# Patient Record
Sex: Female | Born: 1940 | Race: White | Hispanic: No | Marital: Married | Smoking: Former smoker
Health system: Southern US, Community
[De-identification: ages and names within clinical notes are randomized; demographics above are authoritative.]

## PROBLEM LIST (undated history)

## (undated) DIAGNOSIS — C801 Malignant (primary) neoplasm, unspecified: Secondary | ICD-10-CM

## (undated) DIAGNOSIS — E079 Disorder of thyroid, unspecified: Secondary | ICD-10-CM

## (undated) DIAGNOSIS — I10 Essential (primary) hypertension: Secondary | ICD-10-CM

## (undated) HISTORY — PX: ABDOMINAL HYSTERECTOMY: SHX81

## (undated) HISTORY — PX: LOBECTOMY: SHX5089

---

## 2015-05-12 ENCOUNTER — Emergency Department (HOSPITAL_COMMUNITY): Payer: Medicare Other

## 2015-05-12 ENCOUNTER — Encounter (HOSPITAL_COMMUNITY): Payer: Self-pay

## 2015-05-12 ENCOUNTER — Inpatient Hospital Stay (HOSPITAL_COMMUNITY)
Admission: EM | Admit: 2015-05-12 | Discharge: 2015-05-17 | DRG: 470 | Disposition: A | Discharge status: Skilled Nursing Facility | Payer: Medicare Other | Attending: Internal Medicine | Admitting: Internal Medicine

## 2015-05-12 ENCOUNTER — Inpatient Hospital Stay (HOSPITAL_COMMUNITY): Payer: Medicare Other

## 2015-05-12 DIAGNOSIS — D72829 Elevated white blood cell count, unspecified: Secondary | ICD-10-CM | POA: Insufficient documentation

## 2015-05-12 DIAGNOSIS — E870 Hyperosmolality and hypernatremia: Secondary | ICD-10-CM | POA: Diagnosis present

## 2015-05-12 DIAGNOSIS — Y92008 Other place in unspecified non-institutional (private) residence as the place of occurrence of the external cause: Secondary | ICD-10-CM

## 2015-05-12 DIAGNOSIS — Z85118 Personal history of other malignant neoplasm of bronchus and lung: Secondary | ICD-10-CM

## 2015-05-12 DIAGNOSIS — S72001S Fracture of unspecified part of neck of right femur, sequela: Secondary | ICD-10-CM | POA: Diagnosis not present

## 2015-05-12 DIAGNOSIS — I10 Essential (primary) hypertension: Secondary | ICD-10-CM | POA: Diagnosis present

## 2015-05-12 DIAGNOSIS — Z79899 Other long term (current) drug therapy: Secondary | ICD-10-CM

## 2015-05-12 DIAGNOSIS — S72009A Fracture of unspecified part of neck of unspecified femur, initial encounter for closed fracture: Secondary | ICD-10-CM | POA: Diagnosis present

## 2015-05-12 DIAGNOSIS — K5900 Constipation, unspecified: Secondary | ICD-10-CM | POA: Diagnosis present

## 2015-05-12 DIAGNOSIS — Z902 Acquired absence of lung [part of]: Secondary | ICD-10-CM | POA: Diagnosis present

## 2015-05-12 DIAGNOSIS — W010XXA Fall on same level from slipping, tripping and stumbling without subsequent striking against object, initial encounter: Secondary | ICD-10-CM | POA: Diagnosis present

## 2015-05-12 DIAGNOSIS — S72001A Fracture of unspecified part of neck of right femur, initial encounter for closed fracture: Secondary | ICD-10-CM | POA: Insufficient documentation

## 2015-05-12 DIAGNOSIS — Z96649 Presence of unspecified artificial hip joint: Secondary | ICD-10-CM

## 2015-05-12 DIAGNOSIS — Z87891 Personal history of nicotine dependence: Secondary | ICD-10-CM

## 2015-05-12 DIAGNOSIS — E039 Hypothyroidism, unspecified: Secondary | ICD-10-CM | POA: Diagnosis present

## 2015-05-12 DIAGNOSIS — S72011A Unspecified intracapsular fracture of right femur, initial encounter for closed fracture: Principal | ICD-10-CM | POA: Diagnosis present

## 2015-05-12 DIAGNOSIS — D509 Iron deficiency anemia, unspecified: Secondary | ICD-10-CM | POA: Diagnosis present

## 2015-05-12 DIAGNOSIS — M25551 Pain in right hip: Secondary | ICD-10-CM | POA: Diagnosis present

## 2015-05-12 HISTORY — DX: Disorder of thyroid, unspecified: E07.9

## 2015-05-12 HISTORY — DX: Essential (primary) hypertension: I10

## 2015-05-12 HISTORY — DX: Malignant (primary) neoplasm, unspecified: C80.1

## 2015-05-12 LAB — CBC WITH DIFFERENTIAL/PLATELET
Basophils Absolute: 0 10*3/uL (ref 0.0–0.1)
Basophils Relative: 0 % (ref 0–1)
Eosinophils Absolute: 0 10*3/uL (ref 0.0–0.7)
Eosinophils Relative: 0 % (ref 0–5)
HEMATOCRIT: 46.6 % — AB (ref 36.0–46.0)
HEMOGLOBIN: 15.9 g/dL — AB (ref 12.0–15.0)
Lymphocytes Relative: 5 % — ABNORMAL LOW (ref 12–46)
Lymphs Abs: 0.8 10*3/uL (ref 0.7–4.0)
MCH: 30.9 pg (ref 26.0–34.0)
MCHC: 34.1 g/dL (ref 30.0–36.0)
MCV: 90.7 fL (ref 78.0–100.0)
MONO ABS: 0.7 10*3/uL (ref 0.1–1.0)
Monocytes Relative: 4 % (ref 3–12)
NEUTROS PCT: 91 % — AB (ref 43–77)
Neutro Abs: 15.5 10*3/uL — ABNORMAL HIGH (ref 1.7–7.7)
Platelets: 218 10*3/uL (ref 150–400)
RBC: 5.14 MIL/uL — ABNORMAL HIGH (ref 3.87–5.11)
RDW: 13.2 % (ref 11.5–15.5)
WBC: 16.9 10*3/uL — ABNORMAL HIGH (ref 4.0–10.5)

## 2015-05-12 LAB — COMPREHENSIVE METABOLIC PANEL
ALBUMIN: 5 g/dL (ref 3.5–5.0)
ALT: 25 U/L (ref 14–54)
AST: 31 U/L (ref 15–41)
Alkaline Phosphatase: 98 U/L (ref 38–126)
Anion gap: 9 (ref 5–15)
BUN: 14 mg/dL (ref 6–20)
CALCIUM: 8.5 mg/dL — AB (ref 8.9–10.3)
CO2: 25 mmol/L (ref 22–32)
Chloride: 105 mmol/L (ref 101–111)
Creatinine, Ser: 0.73 mg/dL (ref 0.44–1.00)
GFR calc Af Amer: >60 mL/min (ref >60)
Glucose, Bld: 148 mg/dL — ABNORMAL HIGH (ref 65–99)
Potassium: 3.7 mmol/L (ref 3.5–5.1)
Sodium: 139 mmol/L (ref 135–145)
Total Bilirubin: 0.7 mg/dL (ref 0.3–1.2)
Total Protein: 8.1 g/dL (ref 6.5–8.1)

## 2015-05-12 LAB — SURGICAL PCR SCREEN
MRSA, PCR: NEGATIVE
STAPHYLOCOCCUS AUREUS: NEGATIVE

## 2015-05-12 LAB — I-STAT TROPONIN, ED: Troponin i, poc: 0.03 ng/mL (ref 0.00–0.08)

## 2015-05-12 LAB — PROTIME-INR
INR: 1.06 (ref 0.00–1.49)
Prothrombin Time: 14 seconds (ref 11.6–15.2)

## 2015-05-12 MED ORDER — HEPARIN SODIUM (PORCINE) 5000 UNIT/ML IJ SOLN
5000.0000 [IU] | Freq: Three times a day (TID) | INTRAMUSCULAR | Status: DC
  Administered 2015-05-12 – 2015-05-13 (×2): 5000 [IU] via SUBCUTANEOUS
  Filled 2015-05-12 (×8): qty 1

## 2015-05-12 MED ORDER — IRBESARTAN 300 MG PO TABS
300.0000 mg | ORAL_TABLET | Freq: Every day | ORAL | Status: DC
  Administered 2015-05-12 – 2015-05-16 (×4): 300 mg via ORAL
  Filled 2015-05-12 (×6): qty 1

## 2015-05-12 MED ORDER — HYDROCODONE-ACETAMINOPHEN 5-325 MG PO TABS
1.0000 | ORAL_TABLET | Freq: Four times a day (QID) | ORAL | Status: DC | PRN
  Administered 2015-05-12 – 2015-05-13 (×3): 2 via ORAL
  Filled 2015-05-12 (×3): qty 2

## 2015-05-12 MED ORDER — FENTANYL CITRATE (PF) 100 MCG/2ML IJ SOLN
50.0000 ug | Freq: Once | INTRAMUSCULAR | Status: AC
  Administered 2015-05-12: 50 ug via INTRAVENOUS
  Filled 2015-05-12: qty 2

## 2015-05-12 MED ORDER — SENNA 8.6 MG PO TABS
1.0000 | ORAL_TABLET | Freq: Two times a day (BID) | ORAL | Status: DC
  Administered 2015-05-13 – 2015-05-16 (×6): 8.6 mg via ORAL
  Filled 2015-05-12 (×4): qty 1

## 2015-05-12 MED ORDER — MORPHINE SULFATE 2 MG/ML IJ SOLN: 0.5000 mg | INTRAMUSCULAR | Status: DC | PRN

## 2015-05-12 MED ORDER — LEVOTHYROXINE SODIUM 100 MCG PO TABS
100.0000 ug | ORAL_TABLET | Freq: Every day | ORAL | Status: DC
  Administered 2015-05-13 – 2015-05-17 (×5): 100 ug via ORAL
  Filled 2015-05-12 (×7): qty 1

## 2015-05-12 NOTE — ED Notes (Signed)
Bed: WA08 Expected date:  Expected time:  Means of arrival:  Comments: 26F fall, right sided pain

## 2015-05-12 NOTE — ED Notes (Signed)
MD at bedside. ADMISSION MD PRESENT 

## 2015-05-12 NOTE — ED Provider Notes (Signed)
CSN: [id number]     Arrival date & time 05/12/15  1303 History   First MD Initiated Contact with Patient 05/12/15 1324     Chief Complaint  Patient presents with  . Fall  . Hip Pain     (Consider location/radiation/quality/duration/timing/severity/associated sxs/prior Treatment) HPI Martha Rowe is a 74 y.o. female with a history of hypertension, thyroid disease, lung cancer status post right upper lobectomy, comes in for evaluation of unwitnessed fall. Patient states approximately 10:00a.m. After breakfast (oatmeal and sunflower seeds), she was walking back towards her living room when she "got tangled up with the cat" and fell forward on her right side. She denies any head trauma or loss of consciousness. She reports lying on the floor for about an hour until someone found her. She reports right hip pain and right sided rib pain that she rates as a 5/10. She does not take any anticoagulation. No other aggravating or modifying factors. Also states she has not taken her medications this morning, was on her way to take them when she fell.  Past Medical History  Diagnosis Date  . Hypertension   . Thyroid disease   . Cancer    Past Surgical History  Procedure Laterality Date  . Lobectomy      rt side/ribs removed  . Abdominal hysterectomy     No family history on file. History  Substance Use Topics  . Smoking status: Former Research scientist (life sciences)  . Smokeless tobacco: Not on file  . Alcohol Use: No   OB History    No data available     Review of Systems  A 10 point review of systems was completed and was negative except for pertinent positives and negatives as mentioned in the history of present illness    Allergies  Ivp dye  Home Medications   Prior to Admission medications   Medication Sig Start Date End Date Taking? Authorizing Provider  levothyroxine (SYNTHROID, LEVOTHROID) 100 MCG tablet Take 100 mcg by mouth daily before breakfast.   Yes Historical Provider, MD  valsartan  (DIOVAN) 320 MG tablet Take 320 mg by mouth daily. 02/23/15  Yes Historical Provider, MD   BP 172/87 mmHg  Pulse 99  Temp(Src) 98.4 F (36.9 C) (Oral)  Resp 18  Ht 5\' 6"  (1.676 m)  Wt 175 lb (79.379 kg)  BMI 28.26 kg/m2  SpO2 97% Physical Exam  Constitutional: She is oriented to person, place, and time. She appears well-developed and well-nourished.  HENT:  Head: Normocephalic and atraumatic.  Mouth/Throat: Oropharynx is clear and moist.  Eyes: Conjunctivae are normal. Pupils are equal, round, and reactive to light. Right eye exhibits no discharge. Left eye exhibits no discharge. No scleral icterus.  Neck: Neck supple.  Cardiovascular: Normal rate, regular rhythm and normal heart sounds.   Pulmonary/Chest: Effort normal and breath sounds normal. No respiratory distress. She has no wheezes. She has no rales.  Abdominal: Soft. There is no tenderness.  Musculoskeletal: She exhibits no tenderness.  Tenderness to palpation of right inferior ribs along axillary line, no crepitus or bony step-offs. No lesions noted. Tenderness diffusely throughout right hip. Distal pulses are intact. Sensation intact to light touch, motor intact.  Neurological: She is alert and oriented to person, place, and time.  Cranial Nerves II-XII grossly intact  Skin: Skin is warm and dry. No rash noted.  Psychiatric: She has a normal mood and affect.  Nursing note and vitals reviewed.   ED Course  Procedures (including critical care time) Labs Review Labs  Reviewed  CBC WITH DIFFERENTIAL/PLATELET - Abnormal; Notable for the following:    WBC 16.9 (*)    RBC 5.14 (*)    Hemoglobin 15.9 (*)    HCT 46.6 (*)    Neutrophils Relative % 91 (*)    Neutro Abs 15.5 (*)    Lymphocytes Relative 5 (*)    All other components within normal limits  COMPREHENSIVE METABOLIC PANEL - Abnormal; Notable for the following:    Glucose, Bld 148 (*)    Calcium 8.5 (*)    All other components within normal limits  PROTIME-INR   I-STAT TROPOININ, ED    Imaging Review Dg Chest Portable 1 View  05/12/2015   CLINICAL DATA:  Preop evaluation for hip surgery. Previous lung cancer.  EXAM: PORTABLE CHEST - 1 VIEW  COMPARISON:  None available  FINDINGS: Postop changes in the right hilum, suspect prior right upper lobectomy. Borderline heart size with central vascular prominence. No focal pneumonia, collapse or consolidation. Negative for edema, effusion or pneumothorax. Bones are osteopenic. Prior partial rib resection of the right posterior eighth rib. Atherosclerosis noted of the aorta. Trachea is midline.  IMPRESSION: Chronic postop findings from prior right lung surgery.  Borderline cardiomegaly and vascular congestion  Aortic atherosclerosis  No superimposed acute process   Electronically Signed   By: Jerilynn Mages.  Shick M.D.   On: 05/12/2015 14:29   Dg Hip Unilat  With Pelvis 2-3 Views Right  05/12/2015   CLINICAL DATA:  Status post fall, pain lower back and right hip.  EXAM: DG HIP (WITH OR WITHOUT PELVIS) 2-3V RIGHT  COMPARISON:  None.  FINDINGS: There is a displaced fracture within the subcapital region of the right femoral neck with associated mild angulation deformity and probable mild impaction at the fracture site. Small avulsion fracture fragment noted medially. Right femoral head remains well positioned relative to the acetabulum.  Osseous structures of the adjacent pelvis appear intact and well aligned. Suspect mild degenerative change in the lower lumbar spine. Soft tissues about the pelvis and right hip are unremarkable.  IMPRESSION: Displaced fracture within the subcapital region of the right femoral neck as detailed above. Right femoral head remains well positioned relative to the acetabulum.  These results were called by telephone at the time of interpretation on 05/12/2015 at 2:01 pm to Dr. Shirlyn Goltz , who verbally acknowledged these results.   Electronically Signed   By: Franki Cabot M.D.   On: 05/12/2015 14:04     EKG  Interpretation None     Meds given in ED:  Medications  fentaNYL (SUBLIMAZE) injection 50 mcg (50 mcg Intravenous Given 05/12/15 1445)    New Prescriptions   No medications on file   Filed Vitals:   05/12/15 1321 05/12/15 1500 05/12/15 1502 05/12/15 1601  BP:  187/91 187/91 172/87  Pulse:  94 94 99  Temp:    98.4 F (36.9 C)  TempSrc:    Oral  Resp:  15 12 18   Height: 5\' 6"  (1.676 m)     Weight: 175 lb (79.379 kg)     SpO2:  91% 94% 97%    MDM  Patient with history of hypertension and thyroid disorder here for evaluation after a fall this morning after breakfast at 10:00. Patient suffered mechanical fall after tripping over a cat. Landed on right side, found to have displaced fracture within subcapital region of right from oral neck. No head trauma or LOC. Patient does not take anticoagulation. Chest x-ray negative for any acute  cardiopulmonary pathology. Patient remains neurovascularly intact. Vital signs stable Discussed with orthopedics, Dr. Ronnie Derby, Requests consultation to medicine service for admission.  Spoke to hospitalist, Dr. Wendee Beavers to see in the ED. Patient admitted to medicine service. Final diagnoses:  Femur neck fracture, right, closed, initial encounter        Comer Locket, PA-C 05/12/15 [place], MD 05/13/15 [phone]

## 2015-05-12 NOTE — ED Notes (Signed)
Per [facility]- Pt with unwittnessed fall to rt side resulting in rt hip pain. Denies neck yet c/o of mid and lower back pain. Pt also c/o of rt hip pain. No shortening or rotation to rt hip. NO LOC. KED for immobilization er EMS transport. IV Fentanyl 12mcg and Zofran 4mg  given in route.

## 2015-05-12 NOTE — H&P (Signed)
[facility] History and Physical  Martha Rowe WPY:[id number] DOB: [birth year] DOA: 05/12/2015  Referring physician: Dr. Darl Householder PCP: No primary care provider on file.   Chief Complaint: Right hip pain  HPI: Martha Rowe is a 74 y.o. female  Caucasian female with history of essential hypertension and acquired hypothyroidism who presents after a mechanical fall. Patient landed on her right side subsequently developed right hip discomfort. Was brought by family to the ED for further evaluation recommendations. After x-ray evaluation patient was diagnosed with right hip fracture. The patient states the pain is nonradiating and sharp. Has been persistent since onset after fall. Placing weight on right lower extremity causes discomfort.  ED consulted orthopedic surgeon who has requested medical evaluation for admission   Review of Systems:  Constitutional:  No weight loss, night sweats, Fevers, chills, fatigue.  HEENT:  No headaches, Difficulty swallowing,Tooth/dental problems,Sore throat,  No sneezing, itching, ear ache, nasal congestion, post nasal drip,  Cardio-vascular:  No chest pain, Orthopnea, PND, swelling in lower extremities, anasarca, dizziness, palpitations  GI:  No heartburn, indigestion, abdominal pain, nausea, vomiting, diarrhea, change in bowel habits, loss of appetite  Resp:  No shortness of breath with exertion or at rest. No excess mucus, no productive cough, No non-productive cough, No coughing up of blood.No change in color of mucus.No wheezing.No chest wall deformity  Skin:  no rash or lesions.  GU:  no dysuria, change in color of urine, no urgency or frequency. No flank pain.  Musculoskeletal:  + joint pain or swelling. + decreased range of motion of RLE (due to pain). No back pain.  Psych:  No change in mood or affect. No depression or anxiety. No memory loss.   Past Medical History  Diagnosis Date  . Hypertension   . Thyroid disease   . Cancer    Past  Surgical History  Procedure Laterality Date  . Lobectomy      rt side/ribs removed  . Abdominal hysterectomy     Social History:  reports that she has quit smoking. She does not have any smokeless tobacco history on file. She reports that she does not drink alcohol or use illicit drugs.  Allergies  Allergen Reactions  . Ivp Dye [Iodinated Diagnostic Agents] Hives    Family history - None reported when asked directly.  Prior to Admission medications   Medication Sig Start Date End Date Taking? Authorizing Provider  levothyroxine (SYNTHROID, LEVOTHROID) 100 MCG tablet Take 100 mcg by mouth daily before breakfast.   Yes Historical Provider, MD  valsartan (DIOVAN) 320 MG tablet Take 320 mg by mouth daily. 02/23/15  Yes Historical Provider, MD   Physical Exam: Filed Vitals:   05/12/15 1309 05/12/15 1321 05/12/15 1500 05/12/15 1502  BP: 205/97  187/91 187/91  Pulse: 92  94 94  Temp: 98.2 F (36.8 C)     TempSrc: Oral     Resp: 20  15 12   Height:  5\' 6"  (1.676 m)    Weight:  79.379 kg (175 lb)    SpO2: 96%  91% 94%    Wt Readings from Last 3 Encounters:  05/12/15 79.379 kg (175 lb)    General:  Appears calm and comfortable Eyes: PERRL, normal lids, irises & conjunctiva ENT: grossly normal hearing, lips & tongue Neck: no LAD, masses or thyromegaly Cardiovascular: RRR, no m/r/g. No LE edema. Respiratory: CTA bilaterally, no w/r/r. Normal respiratory effort. Abdomen: soft, nt, nd Skin: no rash or induration seen on limited exam Musculoskeletal: Discomfort to palpation  over right lower extremity but specifically her hip Psychiatric: grossly normal mood and affect, speech fluent and appropriate Neurologic: Sensation to light touch intact at feet bilaterally, no facial asymmetry, answers questions appropriately           Labs on Admission:  Basic Metabolic Panel:  Recent Labs Lab 05/12/15 1445  NA 139  K 3.7  CL 105  CO2 25  GLUCOSE 148*  BUN 14  CREATININE 0.73   CALCIUM 8.5*   Liver Function Tests:  Recent Labs Lab 05/12/15 1445  AST 31  ALT 25  ALKPHOS 98  BILITOT 0.7  PROT 8.1  ALBUMIN 5.0   No results for input(s): LIPASE, AMYLASE in the last 168 hours. No results for input(s): AMMONIA in the last 168 hours. CBC:  Recent Labs Lab 05/12/15 1445  WBC 16.9*  NEUTROABS 15.5*  HGB 15.9*  HCT 46.6*  MCV 90.7  PLT 218   Cardiac Enzymes: No results for input(s): CKTOTAL, CKMB, CKMBINDEX, TROPONINI in the last 168 hours.  BNP (last 3 results) No results for input(s): BNP in the last 8760 hours.  ProBNP (last 3 results) No results for input(s): PROBNP in the last 8760 hours.  CBG: No results for input(s): GLUCAP in the last 168 hours.  Radiological Exams on Admission: Dg Chest Portable 1 View  05/12/2015   CLINICAL DATA:  Preop evaluation for hip surgery. Previous lung cancer.  EXAM: PORTABLE CHEST - 1 VIEW  COMPARISON:  None available  FINDINGS: Postop changes in the right hilum, suspect prior right upper lobectomy. Borderline heart size with central vascular prominence. No focal pneumonia, collapse or consolidation. Negative for edema, effusion or pneumothorax. Bones are osteopenic. Prior partial rib resection of the right posterior eighth rib. Atherosclerosis noted of the aorta. Trachea is midline.  IMPRESSION: Chronic postop findings from prior right lung surgery.  Borderline cardiomegaly and vascular congestion  Aortic atherosclerosis  No superimposed acute process   Electronically Signed   By: Jerilynn Mages.  Shick M.D.   On: 05/12/2015 14:29   Dg Hip Unilat  With Pelvis 2-3 Views Right  05/12/2015   CLINICAL DATA:  Status post fall, pain lower back and right hip.  EXAM: DG HIP (WITH OR WITHOUT PELVIS) 2-3V RIGHT  COMPARISON:  None.  FINDINGS: There is a displaced fracture within the subcapital region of the right femoral neck with associated mild angulation deformity and probable mild impaction at the fracture site. Small avulsion fracture  fragment noted medially. Right femoral head remains well positioned relative to the acetabulum.  Osseous structures of the adjacent pelvis appear intact and well aligned. Suspect mild degenerative change in the lower lumbar spine. Soft tissues about the pelvis and right hip are unremarkable.  IMPRESSION: Displaced fracture within the subcapital region of the right femoral neck as detailed above. Right femoral head remains well positioned relative to the acetabulum.  These results were called by telephone at the time of interpretation on 05/12/2015 at 2:01 pm to Dr. Shirlyn Goltz , who verbally acknowledged these results.   Electronically Signed   By: Franki Cabot M.D.   On: 05/12/2015 14:04    EKG: Independently reviewed. Sinus rhythm with no ST elevations or depressions  Assessment/Plan Active Problems:   Hip fracture requiring operative repair -We'll place on hip fracture order set, please review for details - Continue supportive therapy with opiate regimen -Orthopedic surgery consulted by ED physician and will evaluate patient for further recommendations    Essential hypernatremia -Currently not well controlled we'll  continue home medication regimen and if blood pressures persistently elevated we'll consider adding another agent    Hypothyroidism (acquired) -Stable continue home medication regimen   Code Status: full DVT Prophylaxis: heparin Family Communication: discussed with spouse and pt Disposition Plan: Med surg with ortho consult Time spent: > 45 minutes  Velvet Bathe [facility] Pager [phone]

## 2015-05-12 NOTE — ED Notes (Signed)
Patient transported to X-ray 

## 2015-05-13 ENCOUNTER — Encounter (HOSPITAL_COMMUNITY): Payer: Self-pay | Admitting: Anesthesiology

## 2015-05-13 ENCOUNTER — Encounter (HOSPITAL_COMMUNITY)
Admission: EM | Disposition: A | Discharge status: Skilled Nursing Facility | Payer: Self-pay | Source: Home / Self Care | Attending: Internal Medicine

## 2015-05-13 ENCOUNTER — Inpatient Hospital Stay (HOSPITAL_COMMUNITY): Payer: Medicare Other

## 2015-05-13 ENCOUNTER — Inpatient Hospital Stay (HOSPITAL_COMMUNITY): Payer: Medicare Other | Admitting: Certified Registered Nurse Anesthetist

## 2015-05-13 DIAGNOSIS — S72001A Fracture of unspecified part of neck of right femur, initial encounter for closed fracture: Secondary | ICD-10-CM

## 2015-05-13 DIAGNOSIS — D72829 Elevated white blood cell count, unspecified: Secondary | ICD-10-CM

## 2015-05-13 DIAGNOSIS — E870 Hyperosmolality and hypernatremia: Secondary | ICD-10-CM

## 2015-05-13 DIAGNOSIS — E039 Hypothyroidism, unspecified: Secondary | ICD-10-CM

## 2015-05-13 HISTORY — PX: TOTAL HIP ARTHROPLASTY: SHX124

## 2015-05-13 LAB — CBC
HEMATOCRIT: 43.8 % (ref 36.0–46.0)
Hemoglobin: 14.7 g/dL (ref 12.0–15.0)
MCH: 30.4 pg (ref 26.0–34.0)
MCHC: 33.6 g/dL (ref 30.0–36.0)
MCV: 90.7 fL (ref 78.0–100.0)
Platelets: 210 10*3/uL (ref 150–400)
RBC: 4.83 MIL/uL (ref 3.87–5.11)
RDW: 13.5 % (ref 11.5–15.5)
WBC: 10.2 10*3/uL (ref 4.0–10.5)

## 2015-05-13 LAB — BASIC METABOLIC PANEL
ANION GAP: 10 (ref 5–15)
BUN: 19 mg/dL (ref 6–20)
CO2: 23 mmol/L (ref 22–32)
Calcium: 8.2 mg/dL — ABNORMAL LOW (ref 8.9–10.3)
Chloride: 107 mmol/L (ref 101–111)
Creatinine, Ser: 0.99 mg/dL (ref 0.44–1.00)
GFR calc Af Amer: >60 mL/min (ref >60)
GFR calc non Af Amer: 54 mL/min — ABNORMAL LOW (ref >60)
Glucose, Bld: 113 mg/dL — ABNORMAL HIGH (ref 65–99)
Potassium: 3.7 mmol/L (ref 3.5–5.1)
Sodium: 140 mmol/L (ref 135–145)

## 2015-05-13 LAB — ABO/RH: ABO/RH(D): O POS

## 2015-05-13 LAB — TYPE AND SCREEN
ABO/RH(D): O POS
Antibody Screen: NEGATIVE

## 2015-05-13 SURGERY — ARTHROPLASTY, HIP, TOTAL, ANTERIOR APPROACH
Anesthesia: General | Site: Hip | Laterality: Right

## 2015-05-13 MED ORDER — METOCLOPRAMIDE HCL 10 MG PO TABS: 5.0000 mg | ORAL_TABLET | Freq: Three times a day (TID) | ORAL | Status: DC | PRN

## 2015-05-13 MED ORDER — HYDROCODONE-ACETAMINOPHEN 5-325 MG PO TABS
1.0000 | ORAL_TABLET | ORAL | Status: DC | PRN
  Administered 2015-05-14 – 2015-05-15 (×5): 2 via ORAL
  Administered 2015-05-16: 1 via ORAL
  Administered 2015-05-16 – 2015-05-17 (×2): 2 via ORAL
  Filled 2015-05-13 (×2): qty 2
  Filled 2015-05-13: qty 1
  Filled 2015-05-13 (×5): qty 2

## 2015-05-13 MED ORDER — HYDROMORPHONE HCL 1 MG/ML IJ SOLN
INTRAMUSCULAR | Status: DC | PRN
  Administered 2015-05-13 (×2): 0.5 mg via INTRAVENOUS

## 2015-05-13 MED ORDER — EPHEDRINE SULFATE 50 MG/ML IJ SOLN
INTRAMUSCULAR | Status: AC
  Filled 2015-05-13: qty 1

## 2015-05-13 MED ORDER — DEXAMETHASONE SODIUM PHOSPHATE 10 MG/ML IJ SOLN
INTRAMUSCULAR | Status: DC | PRN
  Administered 2015-05-13: 10 mg via INTRAVENOUS

## 2015-05-13 MED ORDER — MAGNESIUM CITRATE PO SOLN: 1.0000 | Freq: Once | ORAL | Status: AC | PRN

## 2015-05-13 MED ORDER — SUFENTANIL CITRATE 50 MCG/ML IV SOLN
INTRAVENOUS | Status: DC | PRN
  Administered 2015-05-13 (×2): 10 ug via INTRAVENOUS
  Administered 2015-05-13: 2.5 ug via INTRAVENOUS
  Administered 2015-05-13: 10 ug via INTRAVENOUS

## 2015-05-13 MED ORDER — ACETAMINOPHEN 10 MG/ML IV SOLN
INTRAVENOUS | Status: AC
  Filled 2015-05-13: qty 100

## 2015-05-13 MED ORDER — POLYETHYLENE GLYCOL 3350 17 G PO PACK
17.0000 g | PACK | Freq: Two times a day (BID) | ORAL | Status: DC
  Administered 2015-05-15 – 2015-05-16 (×4): 17 g via ORAL
  Filled 2015-05-13 (×6): qty 1

## 2015-05-13 MED ORDER — CISATRACURIUM BESYLATE 20 MG/10ML IV SOLN
INTRAVENOUS | Status: AC
  Filled 2015-05-13: qty 10

## 2015-05-13 MED ORDER — CEFAZOLIN SODIUM-DEXTROSE 2-3 GM-% IV SOLR
2.0000 g | INTRAVENOUS | Status: AC
Start: 2015-05-13 — End: 2015-05-13
  Administered 2015-05-13: 2 g via INTRAVENOUS

## 2015-05-13 MED ORDER — METHOCARBAMOL 500 MG PO TABS: 500.0000 mg | ORAL_TABLET | Freq: Four times a day (QID) | ORAL | Status: DC | PRN

## 2015-05-13 MED ORDER — ONDANSETRON HCL 4 MG/2ML IJ SOLN
INTRAMUSCULAR | Status: AC
  Filled 2015-05-13: qty 2

## 2015-05-13 MED ORDER — HYDROCODONE-ACETAMINOPHEN 7.5-325 MG PO TABS: 1.0000 | ORAL_TABLET | ORAL | Status: AC | PRN

## 2015-05-13 MED ORDER — SODIUM CHLORIDE 0.9 % IV SOLN: Freq: Once | INTRAVENOUS | Status: DC

## 2015-05-13 MED ORDER — GLYCOPYRROLATE 0.2 MG/ML IJ SOLN
INTRAMUSCULAR | Status: DC | PRN
  Administered 2015-05-13: 0.6 mg via INTRAVENOUS

## 2015-05-13 MED ORDER — HYDROMORPHONE HCL 1 MG/ML IJ SOLN
INTRAMUSCULAR | Status: AC
  Filled 2015-05-13: qty 1

## 2015-05-13 MED ORDER — PHENOL 1.4 % MT LIQD: 1.0000 | OROMUCOSAL | Status: DC | PRN

## 2015-05-13 MED ORDER — CEFAZOLIN SODIUM-DEXTROSE 2-3 GM-% IV SOLR
INTRAVENOUS | Status: AC
  Filled 2015-05-13: qty 50

## 2015-05-13 MED ORDER — SODIUM CHLORIDE 0.9 % IR SOLN
Status: DC | PRN
  Administered 2015-05-13: 1000 mL

## 2015-05-13 MED ORDER — LIDOCAINE HCL (CARDIAC) 20 MG/ML IV SOLN
INTRAVENOUS | Status: DC | PRN
  Administered 2015-05-13: 50 mg via INTRAVENOUS

## 2015-05-13 MED ORDER — LIDOCAINE HCL (CARDIAC) 20 MG/ML IV SOLN
INTRAVENOUS | Status: AC
  Filled 2015-05-13: qty 5

## 2015-05-13 MED ORDER — PROPOFOL 10 MG/ML IV BOLUS
INTRAVENOUS | Status: AC
  Filled 2015-05-13: qty 20

## 2015-05-13 MED ORDER — SUCCINYLCHOLINE CHLORIDE 20 MG/ML IJ SOLN
INTRAMUSCULAR | Status: DC | PRN
  Administered 2015-05-13: 100 mg via INTRAVENOUS

## 2015-05-13 MED ORDER — MEPERIDINE HCL 50 MG/ML IJ SOLN: 6.2500 mg | INTRAMUSCULAR | Status: DC | PRN

## 2015-05-13 MED ORDER — EPHEDRINE SULFATE 50 MG/ML IJ SOLN
INTRAMUSCULAR | Status: DC | PRN
  Administered 2015-05-13 (×2): 10 mg via INTRAVENOUS

## 2015-05-13 MED ORDER — ONDANSETRON HCL 4 MG/2ML IJ SOLN
4.0000 mg | Freq: Once | INTRAMUSCULAR | Status: AC | PRN
  Administered 2015-05-13: 4 mg via INTRAVENOUS

## 2015-05-13 MED ORDER — SUFENTANIL CITRATE 50 MCG/ML IV SOLN
INTRAVENOUS | Status: AC
  Filled 2015-05-13: qty 1

## 2015-05-13 MED ORDER — PROMETHAZINE HCL 25 MG/ML IJ SOLN
6.2500 mg | Freq: Once | INTRAMUSCULAR | Status: AC
  Administered 2015-05-13: 6.25 mg via INTRAVENOUS

## 2015-05-13 MED ORDER — ONDANSETRON HCL 4 MG PO TABS: 4.0000 mg | ORAL_TABLET | Freq: Four times a day (QID) | ORAL | Status: DC | PRN

## 2015-05-13 MED ORDER — ASPIRIN EC 325 MG PO TBEC
325.0000 mg | DELAYED_RELEASE_TABLET | Freq: Two times a day (BID) | ORAL | Status: DC
  Administered 2015-05-14 – 2015-05-17 (×7): 325 mg via ORAL
  Filled 2015-05-13 (×10): qty 1

## 2015-05-13 MED ORDER — MENTHOL 3 MG MT LOZG: 1.0000 | LOZENGE | OROMUCOSAL | Status: DC | PRN

## 2015-05-13 MED ORDER — TIZANIDINE HCL 4 MG PO TABS: 4.0000 mg | ORAL_TABLET | Freq: Four times a day (QID) | ORAL | Status: AC | PRN

## 2015-05-13 MED ORDER — NEOSTIGMINE METHYLSULFATE 10 MG/10ML IV SOLN
INTRAVENOUS | Status: DC | PRN
  Administered 2015-05-13: 4 mg via INTRAVENOUS

## 2015-05-13 MED ORDER — FERROUS SULFATE 325 (65 FE) MG PO TABS
325.0000 mg | ORAL_TABLET | Freq: Three times a day (TID) | ORAL | Status: DC
  Administered 2015-05-14 – 2015-05-17 (×10): 325 mg via ORAL
  Filled 2015-05-13 (×13): qty 1

## 2015-05-13 MED ORDER — ASPIRIN EC 325 MG PO TBEC: 325.0000 mg | DELAYED_RELEASE_TABLET | Freq: Two times a day (BID) | ORAL | Status: AC

## 2015-05-13 MED ORDER — PROPOFOL 10 MG/ML IV BOLUS
INTRAVENOUS | Status: DC | PRN
  Administered 2015-05-13: 150 mg via INTRAVENOUS

## 2015-05-13 MED ORDER — ACETAMINOPHEN 10 MG/ML IV SOLN
1000.0000 mg | Freq: Once | INTRAVENOUS | Status: AC
  Administered 2015-05-13: 1000 mg via INTRAVENOUS

## 2015-05-13 MED ORDER — NEOSTIGMINE METHYLSULFATE 10 MG/10ML IV SOLN
INTRAVENOUS | Status: AC
  Filled 2015-05-13: qty 1

## 2015-05-13 MED ORDER — DEXAMETHASONE SODIUM PHOSPHATE 10 MG/ML IJ SOLN
INTRAMUSCULAR | Status: AC
  Filled 2015-05-13: qty 1

## 2015-05-13 MED ORDER — ONDANSETRON HCL 4 MG/2ML IJ SOLN: 4.0000 mg | Freq: Four times a day (QID) | INTRAMUSCULAR | Status: DC | PRN

## 2015-05-13 MED ORDER — DOCUSATE SODIUM 100 MG PO CAPS
100.0000 mg | ORAL_CAPSULE | Freq: Two times a day (BID) | ORAL | Status: DC
  Administered 2015-05-13 – 2015-05-16 (×7): 100 mg via ORAL
  Filled 2015-05-13 (×9): qty 1

## 2015-05-13 MED ORDER — ALUM & MAG HYDROXIDE-SIMETH 200-200-20 MG/5ML PO SUSP
30.0000 mL | ORAL | Status: DC | PRN
Start: 2015-05-13 — End: 2015-05-17

## 2015-05-13 MED ORDER — PROMETHAZINE HCL 25 MG/ML IJ SOLN
INTRAMUSCULAR | Status: AC
  Filled 2015-05-13: qty 1

## 2015-05-13 MED ORDER — LACTATED RINGERS IV SOLN
INTRAVENOUS | Status: DC | PRN
  Administered 2015-05-13 (×3): via INTRAVENOUS

## 2015-05-13 MED ORDER — HYDROMORPHONE HCL 1 MG/ML IJ SOLN: 0.2500 mg | INTRAMUSCULAR | Status: DC | PRN

## 2015-05-13 MED ORDER — CEFAZOLIN SODIUM-DEXTROSE 2-3 GM-% IV SOLR
2.0000 g | Freq: Four times a day (QID) | INTRAVENOUS | Status: AC
  Administered 2015-05-13 – 2015-05-14 (×2): 2 g via INTRAVENOUS
  Filled 2015-05-13 (×2): qty 50

## 2015-05-13 MED ORDER — SODIUM CHLORIDE 0.9 % IJ SOLN
INTRAMUSCULAR | Status: AC
  Filled 2015-05-13: qty 10

## 2015-05-13 MED ORDER — METOCLOPRAMIDE HCL 5 MG/ML IJ SOLN: 5.0000 mg | Freq: Three times a day (TID) | INTRAMUSCULAR | Status: DC | PRN

## 2015-05-13 MED ORDER — BISACODYL 10 MG RE SUPP: 10.0000 mg | Freq: Every day | RECTAL | Status: DC | PRN

## 2015-05-13 MED ORDER — ONDANSETRON HCL 4 MG/2ML IJ SOLN
INTRAMUSCULAR | Status: DC | PRN
  Administered 2015-05-13: 4 mg via INTRAVENOUS

## 2015-05-13 MED ORDER — SODIUM CHLORIDE 0.9 % IV SOLN
INTRAVENOUS | Status: DC
  Administered 2015-05-13 – 2015-05-14 (×2): via INTRAVENOUS

## 2015-05-13 MED ORDER — METHOCARBAMOL 1000 MG/10ML IJ SOLN
500.0000 mg | Freq: Four times a day (QID) | INTRAVENOUS | Status: DC | PRN
  Filled 2015-05-13: qty 5

## 2015-05-13 MED ORDER — GLYCOPYRROLATE 0.2 MG/ML IJ SOLN
INTRAMUSCULAR | Status: AC
  Filled 2015-05-13: qty 3

## 2015-05-13 MED ORDER — CISATRACURIUM BESYLATE (PF) 10 MG/5ML IV SOLN
INTRAVENOUS | Status: DC | PRN
  Administered 2015-05-13: 6 mg via INTRAVENOUS

## 2015-05-13 SURGICAL SUPPLY — 43 items
BAG DECANTER FOR FLEXI CONT (MISCELLANEOUS) IMPLANT
BAG ZIPLOCK 12X15 (MISCELLANEOUS) IMPLANT
CAPT HIP TOTAL 2 ×3 IMPLANT
CHLORAPREP W/TINT 26ML (MISCELLANEOUS) ×3 IMPLANT
COVER PERINEAL POST (MISCELLANEOUS) ×3 IMPLANT
DRAPE C-ARM 42X120 X-RAY (DRAPES) ×3 IMPLANT
DRAPE STERI IOBAN 125X83 (DRAPES) ×3 IMPLANT
DRAPE U-SHAPE 47X51 STRL (DRAPES) ×9 IMPLANT
DRSG AQUACEL AG ADV 3.5X10 (GAUZE/BANDAGES/DRESSINGS) ×3 IMPLANT
DURAPREP 26ML APPLICATOR (WOUND CARE) IMPLANT
ELECT BLADE TIP CTD 4 INCH (ELECTRODE) ×3 IMPLANT
ELECT PENCIL ROCKER SW 15FT (MISCELLANEOUS) IMPLANT
ELECT REM PT RETURN 15FT ADLT (MISCELLANEOUS) IMPLANT
ELECT REM PT RETURN 9FT ADLT (ELECTROSURGICAL) ×3
ELECTRODE REM PT RTRN 9FT ADLT (ELECTROSURGICAL) ×1 IMPLANT
FACESHIELD WRAPAROUND (MASK) ×12 IMPLANT
GLOVE BIOGEL PI IND STRL 7.5 (GLOVE) ×1 IMPLANT
GLOVE BIOGEL PI IND STRL 8.5 (GLOVE) ×1 IMPLANT
GLOVE BIOGEL PI INDICATOR 7.5 (GLOVE) ×2
GLOVE BIOGEL PI INDICATOR 8.5 (GLOVE) ×2
GLOVE ECLIPSE 8.0 STRL XLNG CF (GLOVE) ×6 IMPLANT
GLOVE ORTHO TXT STRL SZ7.5 (GLOVE) ×3 IMPLANT
GOWN SPEC L3 XXLG W/TWL (GOWN DISPOSABLE) ×3 IMPLANT
GOWN STRL REUS W/TWL LRG LVL3 (GOWN DISPOSABLE) ×3 IMPLANT
HOLDER FOLEY CATH W/STRAP (MISCELLANEOUS) ×3 IMPLANT
KIT BASIN OR (CUSTOM PROCEDURE TRAY) ×3 IMPLANT
LIQUID BAND (GAUZE/BANDAGES/DRESSINGS) ×3 IMPLANT
NDL SAFETY ECLIPSE 18X1.5 (NEEDLE) ×1 IMPLANT
NEEDLE HYPO 18GX1.5 SHARP (NEEDLE) ×2
PACK TOTAL JOINT (CUSTOM PROCEDURE TRAY) ×3 IMPLANT
PEN SKIN MARKING BROAD (MISCELLANEOUS) ×3 IMPLANT
SAW OSC TIP CART 19.5X105X1.3 (SAW) ×3 IMPLANT
SUT MNCRL AB 4-0 PS2 18 (SUTURE) ×3 IMPLANT
SUT VIC AB 1 CT1 36 (SUTURE) ×9 IMPLANT
SUT VIC AB 2-0 CT1 27 (SUTURE) ×4
SUT VIC AB 2-0 CT1 TAPERPNT 27 (SUTURE) ×2 IMPLANT
SUT VLOC 180 0 24IN GS25 (SUTURE) ×3 IMPLANT
SYR 50ML LL SCALE MARK (SYRINGE) IMPLANT
TOWEL OR 17X26 10 PK STRL BLUE (TOWEL DISPOSABLE) ×3 IMPLANT
TOWEL OR NON WOVEN STRL DISP B (DISPOSABLE) IMPLANT
TRAY FOLEY W/METER SILVER 14FR (SET/KITS/TRAYS/PACK) ×3 IMPLANT
WATER STERILE IRR 1500ML POUR (IV SOLUTION) ×3 IMPLANT
YANKAUER SUCT BULB TIP 10FT TU (MISCELLANEOUS) ×3 IMPLANT

## 2015-05-13 NOTE — Clinical Social Work Note (Signed)
Clinical Social Work Assessment  Patient Details  Name: Martha Rowe MRN: [id number] Date of Birth: [birth year]  Date of referral:  05/13/15               Reason for consult:  Discharge Planning, Facility Placement                Permission sought to share information with:    Permission granted to share information::     Name::        Agency::     Relationship::     Contact Information:     Housing/Transportation Living arrangements for the past 2 months:  Apartment Source of Information:  Patient, Spouse Patient Interpreter Needed:  None Criminal Activity/Legal Involvement Pertinent to Current Situation/Hospitalization:  No - Comment as needed Significant Relationships:  Spouse Lives with:  Spouse Do you feel safe going back to the place where you live?   (ST Rehab is needed.) Need for family participation in patient care:  Yes (Comment)  Care giving concerns:  Pt's care cannot be managed at home following hospital d/c.   Social Worker assessment / plan:  Pt hospitalized on 05/12/15 with a hip fx. She is having hip surgery this afternoon. CSW met with pt / spouse to assist with with d/c planning. Pt may require ST Rehab prior to returning home. Pt / spouse are in agreement with this plan, if required. SNF search initiated and bed offers provided. CSW will meet with pt / spouse again following PT recommendations.  Employment status:  Retired Forensic scientist:  Medicare PT Recommendations:  Not assessed at this time Information / Referral to community resources:  [place]  Patient/Family's Response to care: Pt / spouse will follow recommendations made by MD / PT.  Patient/Family's Understanding of and Emotional Response to Diagnosis, Current Treatment, and Prognosis:  Both pt / spouse were very tired when CSW met with them this am. " Neither of [place] had much sleep last night ," pt reported. Pt was looking forward to getting her surgery completed. Pt feels ST  Rehab will most likely be needed. Pt reports, " I'll do what I need to do. I just want to feel better. " Support and reassurance provided.   Emotional Assessment Appearance:  Appears stated age Attitude/Demeanor/Rapport:  Other (cooperative) Affect (typically observed):  Quiet, Pleasant Orientation:  Oriented to Self, Oriented to Place, Oriented to  Time, Oriented to Situation Alcohol / Substance use:  Never Used Psych involvement (Current and /or in the community):  No (Comment)  Discharge Needs  Concerns to be addressed:  Discharge Planning Concerns Readmission within the last 30 days:  No Current discharge risk:  None Barriers to Discharge:  No Barriers Identified   Luretha Rued, [place] 05/13/2015, 4:02 PM

## 2015-05-13 NOTE — Anesthesia Preprocedure Evaluation (Signed)
Anesthesia Evaluation  Patient identified by MRN, date of birth, ID band Patient awake    Reviewed: Allergy & Precautions, NPO status , Patient's Chart, lab work & pertinent test results  Airway Mallampati: I  TM Distance: >3 FB Neck ROM: Full    Dental   Pulmonary former smoker,    Pulmonary exam normal       Cardiovascular hypertension, Pt. on medications Normal cardiovascular exam    Neuro/Psych    GI/Hepatic   Endo/Other  Hypothyroidism   Renal/GU      Musculoskeletal   Abdominal   Peds  Hematology   Anesthesia Other Findings   Reproductive/Obstetrics                             Anesthesia Physical Anesthesia Plan  ASA: II  Anesthesia Plan: General   Post-op Pain Management:    Induction: Intravenous  Airway Management Planned: Oral ETT  Additional Equipment:   Intra-op Plan:   Post-operative Plan: Extubation in OR  Informed Consent: I have reviewed the patients History and Physical, chart, labs and discussed the procedure including the risks, benefits and alternatives for the proposed anesthesia with the patient or authorized representative who has indicated his/her understanding and acceptance.     Plan Discussed with: CRNA and Surgeon  Anesthesia Plan Comments:         Anesthesia Quick Evaluation

## 2015-05-13 NOTE — Care Management Note (Signed)
Case Management Note  Patient Details  Name: Martha Rowe MRN: [id number] Date of Birth: 12/05/1940  Subjective/Objective:                   R Hip fracture requiring operative repair Action/Plan: Discharge planning  Expected Discharge Date:   (unknown)               Expected Discharge Plan:  Skilled Nursing Facility  In-House Referral:     Discharge planning Services  CM Consult  Post Acute Care Choice:    Choice offered to:     DME Arranged:    DME Agency:     HH Arranged:    [place] Agency:     Status of Service:  Completed, signed off  Medicare Important Message Given:    Date Medicare IM Given:    Medicare IM give by:    Date Additional Medicare IM Given:    Additional Medicare Important Message give by:     If discussed at [place] of Stay Meetings, dates discussed:    Additional Comments: CM met with pt and spouse in room to discuss disposition.  Pt states she has already had a visit from [place], Roselyn Reef and the plan is to go to SNF.  Her choice is [facility] to be close to her niece in [place]  No other CM needs were communicated. Dellie Catholic, RN 05/13/2015, 10:58 AM

## 2015-05-13 NOTE — Progress Notes (Signed)
Utilization review completed.  

## 2015-05-13 NOTE — Progress Notes (Signed)
Patient ID: Martha Rowe, female   DOB: 12-Sep-1941, 74 y.o.   MRN: [id number] [facility] PROGRESS NOTE  Martha Rowe DDU:[id number] DOB: 1940-11-07 DOA: 05/12/2015 PCP: Dr. Ernestene Kiel (from [place])   Brief narrative:    74 y.o. female with past medical history of hypothyroidism, hypertension who presented to [facility] ED status post fall. She has sustained right femoral neck fracture. Orthopedic surgery had seen her in consultation and plans for surgery 7/28.  Barrier to discharge: plan for surgery for hip fracture.  Assessment/Plan:    Active Problems: Hip fracture requiring operative repair  - Status post mechanical fall - X ray demonstrated displaced fracture within the subcapital region of the right femoral neck  - Orthopedic surgery has seen the pt in consultation and plans for surgery today   Leukocytosis - Likely reactive  - No fever and no evidence of infection   Essential hypernatremia - Continue avapro  Hypothyroidism  - Continue synthroid    DVT Prophylaxis  - Heparin subQ ordered    Code Status: Full.  Family Communication:  plan of care discussed with the patient Disposition Plan: Surgery today   IV access:  Peripheral IV  Procedures and diagnostic studies:    Dg Chest Portable 1 View 05/12/2015 Chronic postop findings from prior right lung surgery.  Borderline cardiomegaly and vascular congestion  Aortic atherosclerosis  No superimposed acute process   Electronically Signed   By: Jerilynn Mages.  Shick M.D.   On: 05/12/2015 14:29   Dg Hip Unilat  With Pelvis 2-3 Views Right 05/12/2015 Displaced fracture within the subcapital region of the right femoral neck as detailed above. Right femoral head remains well positioned relative to the acetabulum.    Medical Consultants:  Orthopedic surgery   Other Consultants:  Physical therapy  IAnti-Infectives:   None    Leisa Lenz, MD  [facility] Pager [phone]  Time spent in minutes: 25 minutes  If  7PM-7AM, please contact night-coverage [email or web address] Password [facility] 05/13/2015, 11:56 AM   LOS: 1 day    HPI/Subjective: No acute overnight events. Patient reports pain in right leg is currently controlled. Pain is worse with movement.  Objective: Filed Vitals:   05/12/15 1615 05/12/15 1804 05/12/15 2159 05/13/15 0637  BP: 183/152 150/79 140/68 161/72  Pulse: 110 98 92 85  Temp: 98.4 F (36.9 C) 98.9 F (37.2 C) 98.9 F (37.2 C) 98.6 F (37 C)  TempSrc: Oral Oral Oral Oral  Resp: 18 18 18 18   Height:      Weight:      SpO2: 99% 96% 95% 96%    Intake/Output Summary (Last 24 hours) at 05/13/15 1156 Last data filed at 05/13/15 1517  Gross per 24 hour  Intake     60 ml  Output    150 ml  Net    -90 ml    Exam:   General:  Pt is alert, follows commands appropriately, not in acute distress  Cardiovascular: Regular rate and rhythm, S1/S2, no murmurs  Respiratory: Clear to auscultation bilaterally, no wheezing, no crackles, no rhonchi  Abdomen: Soft, non tender, non distended, bowel sounds present  Extremities: No edema, pulses DP and PT palpable bilaterally  Neuro: Grossly nonfocal  Data Reviewed: Basic Metabolic Panel:  Recent Labs Lab 05/12/15 1445 05/13/15 0457  NA 139 140  K 3.7 3.7  CL 105 107  CO2 25 23  GLUCOSE 148* 113*  BUN 14 19  CREATININE 0.73 0.99  CALCIUM 8.5* 8.2*   Liver Function Tests:  Recent Labs Lab 05/12/15 1445  AST 31  ALT 25  ALKPHOS 98  BILITOT 0.7  PROT 8.1  ALBUMIN 5.0   No results for input(s): LIPASE, AMYLASE in the last 168 hours. No results for input(s): AMMONIA in the last 168 hours. CBC:  Recent Labs Lab 05/12/15 1445 05/13/15 0457  WBC 16.9* 10.2  NEUTROABS 15.5*  --   HGB 15.9* 14.7  HCT 46.6* 43.8  MCV 90.7 90.7  PLT 218 210   Cardiac Enzymes: No results for input(s): CKTOTAL, CKMB, CKMBINDEX, TROPONINI in the last 168 hours. BNP: Invalid input(s): POCBNP CBG: No results for input(s): GLUCAP  in the last 168 hours.  Recent Results (from the past 240 hour(s))  Surgical pcr screen     Status: None   Collection Time: 05/12/15  1:03 PM  Result Value Ref Range Status   MRSA, PCR NEGATIVE NEGATIVE Final   Staphylococcus aureus NEGATIVE NEGATIVE Final     Scheduled Meds: . heparin  5,000 Units Subcutaneous 3 times per day  . irbesartan  300 mg Oral Daily  . levothyroxine  100 mcg Oral QAC breakfast  . senna  1 tablet Oral BID   Continuous Infusions: . sodium chloride 50 mL/hr at 05/13/15 1110

## 2015-05-13 NOTE — Consult Note (Addendum)
Reason for Consult: Right femoral neck fracture Referring Physician:  Charlies Silvers, MD  Martha Rowe is an 74 y.o. female.  HPI: 74yo caucasian female with history of essential hypertension and acquired hypothyroidism who presents after a mechanical fall. Patient landed on her right side subsequently developed right hip discomfort. Was brought by family to the ED for further evaluation recommendations. After x-ray evaluation patient was diagnosed with right hip fracture. The patient states the pain is nonradiating and sharp. Has been persistent since onset after fall. Placing weight on right lower extremity causes discomfort.  Initially one of my colleagues was called regarding her care but he has asked if I would assume care. No other complaints of ipsilateral knee pain, contralateral LE pain or UE pain No LOC or head trauma noted    Past Medical History  Diagnosis Date  . Hypertension   . Thyroid disease   . Cancer     Past Surgical History  Procedure Laterality Date  . Lobectomy      rt side/ribs removed  . Abdominal hysterectomy      History reviewed. No pertinent family history.  Social History:  reports that she has quit smoking. She does not have any smokeless tobacco history on file. She reports that she does not drink alcohol or use illicit drugs.  Allergies:  Allergies  Allergen Reactions  . Ivp Dye [Iodinated Diagnostic Agents] Hives    Medications:  I have reviewed the patient's current medications. Scheduled: . heparin  5,000 Units Subcutaneous 3 times per day  . irbesartan  300 mg Oral Daily  . levothyroxine  100 mcg Oral QAC breakfast  . senna  1 tablet Oral BID    Results for orders placed or performed during the hospital encounter of 05/12/15 (from the past 24 hour(s))  Surgical pcr screen     Status: None   Collection Time: 05/12/15  1:03 PM  Result Value Ref Range   MRSA, PCR NEGATIVE NEGATIVE   Staphylococcus aureus NEGATIVE NEGATIVE  CBC with  Differential     Status: Abnormal   Collection Time: 05/12/15  2:45 PM  Result Value Ref Range   WBC 16.9 (H) 4.0 - 10.5 K/uL   RBC 5.14 (H) 3.87 - 5.11 MIL/uL   Hemoglobin 15.9 (H) 12.0 - 15.0 g/dL   HCT 46.6 (H) 36.0 - 46.0 %   MCV 90.7 78.0 - 100.0 fL   MCH 30.9 26.0 - 34.0 pg   MCHC 34.1 30.0 - 36.0 g/dL   RDW 13.2 11.5 - 15.5 %   Platelets 218 150 - 400 K/uL   Neutrophils Relative % 91 (H) 43 - 77 %   Neutro Abs 15.5 (H) 1.7 - 7.7 K/uL   Lymphocytes Relative 5 (L) 12 - 46 %   Lymphs Abs 0.8 0.7 - 4.0 K/uL   Monocytes Relative 4 3 - 12 %   Monocytes Absolute 0.7 0.1 - 1.0 K/uL   Eosinophils Relative 0 0 - 5 %   Eosinophils Absolute 0.0 0.0 - 0.7 K/uL   Basophils Relative 0 0 - 1 %   Basophils Absolute 0.0 0.0 - 0.1 K/uL  Comprehensive metabolic panel     Status: Abnormal   Collection Time: 05/12/15  2:45 PM  Result Value Ref Range   Sodium 139 135 - 145 mmol/L   Potassium 3.7 3.5 - 5.1 mmol/L   Chloride 105 101 - 111 mmol/L   CO2 25 22 - 32 mmol/L   Glucose, Bld 148 (H) 65 - 99  mg/dL   BUN 14 6 - 20 mg/dL   Creatinine, Ser 0.73 0.44 - 1.00 mg/dL   Calcium 8.5 (L) 8.9 - 10.3 mg/dL   Total Protein 8.1 6.5 - 8.1 g/dL   Albumin 5.0 3.5 - 5.0 g/dL   AST 31 15 - 41 U/L   ALT 25 14 - 54 U/L   Alkaline Phosphatase 98 38 - 126 U/L   Total Bilirubin 0.7 0.3 - 1.2 mg/dL   GFR calc non Af Amer >60 >60 mL/min   GFR calc Af Amer >60 >60 mL/min   Anion gap 9 5 - 15  Protime-INR     Status: None   Collection Time: 05/12/15  2:45 PM  Result Value Ref Range   Prothrombin Time 14.0 11.6 - 15.2 seconds   INR 1.06 0.00 - 1.49  I-stat troponin, ED     Status: None   Collection Time: 05/12/15  2:47 PM  Result Value Ref Range   Troponin i, poc 0.03 0.00 - 0.08 ng/mL   Comment 3          CBC     Status: None   Collection Time: 05/13/15  4:57 AM  Result Value Ref Range   WBC 10.2 4.0 - 10.5 K/uL   RBC 4.83 3.87 - 5.11 MIL/uL   Hemoglobin 14.7 12.0 - 15.0 g/dL   HCT 43.8 36.0 -  46.0 %   MCV 90.7 78.0 - 100.0 fL   MCH 30.4 26.0 - 34.0 pg   MCHC 33.6 30.0 - 36.0 g/dL   RDW 13.5 11.5 - 15.5 %   Platelets 210 150 - 400 K/uL  Basic metabolic panel     Status: Abnormal   Collection Time: 05/13/15  4:57 AM  Result Value Ref Range   Sodium 140 135 - 145 mmol/L   Potassium 3.7 3.5 - 5.1 mmol/L   Chloride 107 101 - 111 mmol/L   CO2 23 22 - 32 mmol/L   Glucose, Bld 113 (H) 65 - 99 mg/dL   BUN 19 6 - 20 mg/dL   Creatinine, Ser 0.99 0.44 - 1.00 mg/dL   Calcium 8.2 (L) 8.9 - 10.3 mg/dL   GFR calc non Af Amer 54 (L) >60 mL/min   GFR calc Af Amer >60 >60 mL/min   Anion gap 10 5 - 15    X-ray: CLINICAL DATA: Status post fall, pain lower back and right hip.  EXAM: DG HIP (WITH OR WITHOUT PELVIS) 2-3V RIGHT  COMPARISON: None.  FINDINGS: There is a displaced fracture within the subcapital region of the right femoral neck with associated mild angulation deformity and probable mild impaction at the fracture site. Small avulsion fracture fragment noted medially. Right femoral head remains well positioned relative to the acetabulum.  Osseous structures of the adjacent pelvis appear intact and well aligned. Suspect mild degenerative change in the lower lumbar spine. Soft tissues about the pelvis and right hip are unremarkable.  IMPRESSION: Displaced fracture within the subcapital region of the right femoral neck as detailed above. Right femoral head remains well positioned relative to the acetabulum.  ROS   Constitutional:  No weight loss, night sweats, Fevers, chills, fatigue.  HEENT:  No headaches, Difficulty swallowing,Tooth/dental problems,Sore throat,  No sneezing, itching, ear ache, nasal congestion, post nasal drip,  Cardio-vascular:  No chest pain, Orthopnea, PND, swelling in lower extremities, anasarca, dizziness, palpitations  GI:  No heartburn, indigestion, abdominal pain, nausea, vomiting, diarrhea, change in bowel habits, loss of  appetite  Resp:  No shortness of breath with exertion or at rest. No excess mucus, no productive cough, No non-productive cough, No coughing up of blood.No change in color of mucus.No wheezing.No chest wall deformity  Skin:  no rash or lesions.  GU:  no dysuria, change in color of urine, no urgency or frequency. No flank pain.  Musculoskeletal:  + joint pain or swelling. + decreased range of motion of RLE (due to pain). No back pain.  Psych:  No change in mood or affect. No depression or anxiety. No memory loss.    Blood pressure 161/72, pulse 85, temperature 98.6 F (37 C), temperature source Oral, resp. rate 18, height 5\' 6"  (1.676 m), weight 79.379 kg (175 lb), SpO2 96 %.  Physical Exam   Awake alert, husband in room General medical exam reviewed as pertinent to her admission   Right hip motion painful O/w NVI  Left hip normal Right knee, no effusion or pain to palpation   Assessment/Plan: Right hip, femoral neck fracture  Based on age, health and desired activity level I reviewed with her treatment options I will plan to treat her hip fracture with a right THR to help treat hip fracture and prevent further surgical needs Risks benefits discussed and reviewed with patient and her husband Consent ordered NPO To OR tonight for procedure  Domanique Huesman D 05/13/2015, 12:29 PM

## 2015-05-13 NOTE — Anesthesia Postprocedure Evaluation (Signed)
Anesthesia Post Note  Patient: Martha Rowe  Procedure(s) Performed: Procedure(s) (LRB): TOTAL HIP ARTHROPLASTY ANTERIOR APPROACH (Right)  Anesthesia type: general  Patient location: PACU  Post pain: Pain level controlled  Post assessment: Patient's Cardiovascular Status Stable  Last Vitals:  Filed Vitals:   05/13/15 1921  BP:   Pulse: 111  Temp:   Resp: 16    Post vital signs: Reviewed and stable  Level of consciousness: sedated  Complications: No apparent anesthesia complications

## 2015-05-13 NOTE — Clinical Social Work Placement (Signed)
   CLINICAL SOCIAL WORK PLACEMENT  NOTE  Date:  05/13/2015  Patient Details  Name: Martha Rowe MRN: [id number] Date of Birth: [birth year]  Clinical Social Work is seeking post-discharge placement for this patient at the [place] level of care (*CSW will initial, date and re-position this form in  chart as items are completed):  Yes   Patient/family provided with [facility] Work Department's list of facilities offering this level of care within the geographic area requested by the patient (or if unable, by the patient's family).  Yes   Patient/family informed of their freedom to choose among providers that offer the needed level of care, that participate in Medicare, Medicaid or managed care program needed by the patient, have an available bed and are willing to accept the patient.  Yes   Patient/family informed of [place]'s ownership interest in [facility] and [facility], as well as of the fact that they are under no obligation to receive care at these facilities.  PASRR submitted to EDS on 05/13/15     PASRR number received on 05/13/15     Existing PASRR number confirmed on       FL2 transmitted to all facilities in geographic area requested by pt/family on 05/13/15     FL2 transmitted to all facilities within larger geographic area on       Patient informed that his/her managed care company has contracts with or will negotiate with certain facilities, including the following:        Yes   Patient/family informed of bed offers received.  Patient chooses bed at       Physician recommends and patient chooses bed at      Patient to be transferred to   on  .  Patient to be transferred to facility by       Patient family notified on   of transfer.  Name of family member notified:        PHYSICIAN       Additional Comment:    _______________________________________________ Luretha Rued, Roby 05/13/2015,  4:17 PM

## 2015-05-13 NOTE — Progress Notes (Signed)
This note also relates to the following rows which could not be included: Pulse Rate - Cannot attach notes to unvalidated device data ECG Heart Rate - Cannot attach notes to unvalidated device data Resp - Cannot attach notes to unvalidated device data

## 2015-05-13 NOTE — Op Note (Signed)
NAME:  Martha Rowe                ACCOUNT NO.: [id number]      MEDICAL RECORD NO.: [id number]      FACILITY:  [facility]      PHYSICIAN:  Paralee Cancel D  DATE OF BIRTH:  [birth year]     DATE OF PROCEDURE:  05/13/2015                                 OPERATIVE REPORT         PREOPERATIVE DIAGNOSIS: Displaced Right femoral neck, hip fracture.      POSTOPERATIVE DIAGNOSIS: Dispalced Right femoral neck, hip fracture.      PROCEDURE:  Right total hip replacement through an anterior approach   utilizing DePuy THR system, component size 78mm pinnacle cup, a size 36+4 neutral   Altrex liner, a size 5 Hi Tri Lock stem with a 36+1.5 delta ceramic   ball.      SURGEON:  Pietro Cassis. Alvan Dame, M.D.      ASSISTANT:  Danae Orleans, PA-C      ANESTHESIA:  General.      SPECIMENS:  None.      COMPLICATIONS:  None.      BLOOD LOSS:  600 cc     DRAINS:  None.      INDICATION OF THE PROCEDURE:  Martha Rowe is a 74 y.o. female .  She unfortunately had a fall at her house last night getting her cat tangled up under her feet.  She fell on her right hip and had immediate onset of hip pain.  She was brought to the ER where X-rays revealed a displaced right femoral neck fracture.  She was admitted to the hospital on the Hospitalist service per our protocol.  I reviewed with she and her husband the injury, the fracture pattern, and treatment options.  Based on her age, desired activity level I felt that she would best benefit from a total hip replacement to allow for recovery of function with less chance of needing revision from hemi to THR.  Consent was obtained for   benefit of pain relief.  Specific risks of infection, DVT, component   failure, dislocation, need for revision surgery, as well discussion of   the anterior approach to the hip.  Consent was   obtained for benefit of anterior pain relief through an anterior   approach.      PROCEDURE IN DETAIL:  The patient was brought  to operative theater.   Once adequate anesthesia, preoperative antibiotics, 2gm of Ancef and 10mg  of Decadron administered.   The patient was positioned supine on the OSI Hanna table.  Once adequate   padding of boney process was carried out, we had predraped out the hip, and  used fluoroscopy to confirm orientation of the pelvis and position.      The right hip was then prepped and draped from proximal iliac crest to   mid thigh with shower curtain technique.      Time-out was performed identifying the patient, planned procedure, and   extremity.     An incision was then made 2 cm distal and lateral to the   anterior superior iliac spine extending over the orientation of the   tensor fascia lata muscle and sharp dissection was carried down to the   fascia of the muscle and protractor placed in the  soft tissues.      The fascia was then incised.  The muscle belly was identified and swept   laterally and retractor placed along the superior neck.  Following   cauterization of the circumflex vessels and removing some pericapsular   fat, a second cobra retractor was placed on the inferior neck.  A third   retractor was placed on the anterior acetabulum after elevating the   anterior rectus.  A L-capsulotomy was along the line of the   superior neck to the trochanteric fossa, then extended proximally and   distally.  Tag sutures were placed and the retractors were then placed   intracapsular.  We then identified the trochanteric fossa and   orientation of my neck cut, confirmed this radiographically   and then made a neck osteotomy with the femur on traction.  The femoral   head was removed without difficulty or complication.  Traction was let   off and retractors were placed posterior and anterior around the   acetabulum.      The labrum and foveal tissue were debrided.  I began reaming with a 8mm   reamer and reamed up to 58mm reamer with good bony bed preparation and a 61mm   cup was  chosen.  The final 45mm Pinnacle cup was then impacted under fluoroscopy  to confirm the depth of penetration and orientation with respect to   abduction.  A screw was placed followed by the hole eliminator.  The final   36+4 neutral Altrex liner was impacted with good visualized rim fit.  The cup was positioned anatomically within the acetabular portion of the pelvis.      At this point, the femur was rolled at 80 degrees.  Further capsule was   released off the inferior aspect of the femoral neck.  I then   released the superior capsule proximally.  The hook was placed laterally   along the femur and elevated manually and held in position with the bed   hook.  The leg was then extended and adducted with the leg rolled to 100   degrees of external rotation.  Once the proximal femur was fully   exposed, I used a box osteotome to set orientation.  I then began   broaching with the starting chili pepper broach and passed this by hand and then broached up to 5.  With the 5 broach in place I chose a high offset neck and did a trial reduction.  The offset was appropriate, leg lengths   appeared to be equal, confirmed radiographically.   Given these findings, I went ahead and dislocated the hip, repositioned all   retractors and positioned the right hip in the extended and abducted position.  The final 5Hi Tri Lock stem was   chosen and it was impacted down to the level of neck cut.  Based on this   and the trial reduction, a 36+1.5 delta ceramic ball was chosen and   impacted onto a clean and dry trunnion, and the hip was reduced.  The   hip had been irrigated throughout the case again at this point.  I did   reapproximate the superior capsular leaflet to the anterior leaflet   using #1 Vicryl.  The fascia of the   tensor fascia lata muscle was then reapproximated using #1 Vicryl and #0 V-lock sutures.  The   remaining wound was closed with 2-0 Vicryl and running 4-0 Monocryl.   The hip was  cleaned,  dried, and dressed sterilely using Dermabond and   Aquacel dressing. She was then brought   to recovery room in stable condition tolerating the procedure well.    Danae Orleans, PA-C was present for the entirety of the case involved from   preoperative positioning, perioperative retractor management, general   facilitation of the case, as well as primary wound closure as assistant.            Pietro Cassis Alvan Dame, M.D.        05/13/2015 6:58 PM

## 2015-05-13 NOTE — Anesthesia Procedure Notes (Signed)
Procedure Name: Intubation Date/Time: 05/13/2015 5:22 PM Performed by: Noralyn Pick D Pre-anesthesia Checklist: Patient identified, Emergency Drugs available, Suction available and Patient being monitored Patient Re-evaluated:Patient Re-evaluated prior to inductionOxygen Delivery Method: Circle System Utilized Preoxygenation: Pre-oxygenation with 100% oxygen Intubation Type: IV induction Ventilation: Mask ventilation without difficulty Laryngoscope Size: Mac and 3 Grade View: Grade I Tube type: Oral Number of attempts: 1 Airway Equipment and Method: Stylet and Oral airway Placement Confirmation: ETT inserted through vocal cords under direct vision,  positive ETCO2 and breath sounds checked- equal and bilateral Secured at: 21 cm Tube secured with: Tape Dental Injury: Teeth and Oropharynx as per pre-operative assessment

## 2015-05-13 NOTE — Progress Notes (Signed)
Nutrition Brief Note  RD consulted via Hip Fracture Protocol.  Pt reports UBW of 175 lb, pt's weight is stable. Pt reports she is very hungry right now. Pt is scheduled for surgery at 4pm today. Encouraged pt to request a supplement from the unit if appetite is poor after surgery.  Wt Readings from Last 15 Encounters:  05/12/15 175 lb (79.379 kg)    Body mass index is 28.26 kg/(m^2). Patient meets criteria for overweight based on current BMI.   Current diet order is NPO for surgery. Labs and medications reviewed.   No nutrition interventions warranted at this time. If nutrition issues arise, please consult RD.   Clayton Bibles, MS, RD, LDN Pager: [phone] After Hours Pager: [phone]

## 2015-05-13 NOTE — Progress Notes (Signed)
Give phenergan 6.25 mg IV x 1 in pacu - per Dr Conrad [place]

## 2015-05-13 NOTE — Transfer of Care (Signed)
Immediate Anesthesia Transfer of Care Note  Patient: Martha Rowe  Procedure(s) Performed: Procedure(s): TOTAL HIP ARTHROPLASTY ANTERIOR APPROACH (Right)  Patient Location: PACU  Anesthesia Type:General  Level of Consciousness: awake, alert  and oriented  Airway & Oxygen Therapy: Patient Spontanous Breathing and Patient connected to face mask oxygen  Post-op Assessment: Report given to RN and Post -op Vital signs reviewed and stable  Post vital signs: Reviewed and stable  Last Vitals:  Filed Vitals:   05/13/15 1921  BP:   Pulse: 111  Temp: 36.4 C  Resp: 16    Complications: No apparent anesthesia complications

## 2015-05-13 NOTE — Discharge Instructions (Signed)

## 2015-05-14 ENCOUNTER — Encounter (HOSPITAL_COMMUNITY): Payer: Self-pay | Admitting: Orthopedic Surgery

## 2015-05-14 DIAGNOSIS — D509 Iron deficiency anemia, unspecified: Secondary | ICD-10-CM

## 2015-05-14 DIAGNOSIS — K5900 Constipation, unspecified: Secondary | ICD-10-CM

## 2015-05-14 LAB — BASIC METABOLIC PANEL
Anion gap: 6 (ref 5–15)
BUN: 19 mg/dL (ref 6–20)
CO2: 25 mmol/L (ref 22–32)
Calcium: 7.8 mg/dL — ABNORMAL LOW (ref 8.9–10.3)
Chloride: 109 mmol/L (ref 101–111)
Creatinine, Ser: 0.86 mg/dL (ref 0.44–1.00)
GFR calc Af Amer: >60 mL/min (ref >60)
GFR calc non Af Amer: >60 mL/min (ref >60)
Glucose, Bld: 171 mg/dL — ABNORMAL HIGH (ref 65–99)
Potassium: 3.6 mmol/L (ref 3.5–5.1)
Sodium: 140 mmol/L (ref 135–145)

## 2015-05-14 LAB — CBC
HCT: 38 % (ref 36.0–46.0)
Hemoglobin: 12.6 g/dL (ref 12.0–15.0)
MCH: 30.3 pg (ref 26.0–34.0)
MCHC: 33.2 g/dL (ref 30.0–36.0)
MCV: 91.3 fL (ref 78.0–100.0)
Platelets: 181 10*3/uL (ref 150–400)
RBC: 4.16 MIL/uL (ref 3.87–5.11)
RDW: 13.5 % (ref 11.5–15.5)
WBC: 11.8 10*3/uL — AB (ref 4.0–10.5)

## 2015-05-14 NOTE — Care Management Important Message (Signed)
Important Message  Patient Details  Name: Martha Rowe MRN: [id number] Date of Birth: 25-Feb-1941   Medicare Important Message Given:  Yes-second notification given    Camillo Flaming 05/14/2015, 1:14 [place] Message  Patient Details  Name: Martha Rowe MRN: [id number] Date of Birth: 1940-11-12   Medicare Important Message Given:  Yes-second notification given    Camillo Flaming 05/14/2015, 1:14 PM

## 2015-05-14 NOTE — Progress Notes (Signed)
     Subjective: 1 Day Post-Op Procedure(s) (LRB): TOTAL HIP ARTHROPLASTY ANTERIOR APPROACH (Right)   Patient reports pain as mild, pain controlled. C/o more weakness in the right hip, explained that strength and endurance will gain over time with PT. No events throughout the night.  Objective:   VITALS:   Filed Vitals:   05/14/15 0600  BP: 128/58  Pulse: 98  Temp: 99.1 F (37.3 C)  Resp: 16    Dorsiflexion/Plantar flexion intact Incision: dressing C/D/I No cellulitis present Compartment soft  LABS  Recent Labs  05/12/15 1445 05/13/15 0457 05/14/15 0435  HGB 15.9* 14.7 12.6  HCT 46.6* 43.8 38.0  WBC 16.9* 10.2 11.8*  PLT 218 210 181     Recent Labs  05/12/15 1445 05/13/15 0457 05/14/15 0435  NA 139 140 140  K 3.7 3.7 3.6  BUN 14 19 19   CREATININE 0.73 0.99 0.86  GLUCOSE 148* 113* 171*     Assessment/Plan: 1 Day Post-Op Procedure(s) (LRB): TOTAL HIP ARTHROPLASTY ANTERIOR APPROACH (Right) Advance diet Up with therapy Discharge home with home health / SNF. Should be able to return home, but dependent on progress with PT.   Ortho recommendations:  ASA 325 mg bid for 4 weeks for anticoagulation, unless other medically indicated.  Norco for pain management (Rx written).  Zanaflex for muscle spasms (Rx written).  MiraLax and Colace for constipation  Iron 325 mg tid for 2-3 weeks   WBAT on the right leg.  Dressing to remain in place until follow in clinic in 2 weeks.  Dressing is waterproof and may shower with it in place.  Follow up in 2 weeks at [facility]. Follow up with OLIN,Cebert Dettmann D in 2 weeks.  Contact information:  [facility] [address] [place] Tarius Stangelo   PAC  05/14/2015, 8:40 AM

## 2015-05-14 NOTE — Progress Notes (Signed)
Patient ID: Martha Coulon, female   DOB: May 31, 1941, 74 y.o.   MRN: [id number] [facility] PROGRESS NOTE  Willard Madrigal WYO:[id number] DOB: 12-17-1940 DOA: 05/12/2015 PCP: Dr. Ernestene Kiel (from [place])   Brief narrative:    74 y.o. female with past medical history of hypothyroidism, hypertension who presented to [facility] ED status post fall. She has sustained right femoral neck fracture. Orthopedic surgery had seen her in consultation and plans for surgery 7/28.  Barrier to discharge: s/p right hip arthroplasty 05/13/2015. Likely to SNF or home 05/17/2015.  Assessment/Plan:    Active Problems: Hip fracture requiring operative repair  - Status post mechanical fall - X ray demonstrated displaced fracture within the subcapital region of the right femoral neck  - Patient underwent total right hip arthroplasty - She will continue aspirin 325 mg for 4 weeks on discharge  Leukocytosis - Likely reactive  - No fever and no evidence of infection   Essential hypernatremia - Continue avapro  Hypothyroidism  - Continue synthroid   Iron deficiency anemia - Continue ferrous sulfate supplementation   Constipation - Continue miralax and senna  DVT Prophylaxis  - Heparin subQ ordered while patient in hospital   Code Status: Full.  Family Communication:  plan of care discussed with the patient Disposition Plan: Likely home or skilled nursing facility by Monday, 05/17/2015  IV access:  Peripheral IV  Procedures and diagnostic studies:    Dg Chest Portable 1 View 05/12/2015 Chronic postop findings from prior right lung surgery.  Borderline cardiomegaly and vascular congestion  Aortic atherosclerosis  No superimposed acute process   Electronically Signed   By: Jerilynn Mages.  Shick M.D.   On: 05/12/2015 14:29   Dg Hip Unilat  With Pelvis 2-3 Views Right 05/12/2015 Displaced fracture within the subcapital region of the right femoral neck as detailed above. Right femoral head remains well positioned  relative to the acetabulum.    Medical Consultants:  Orthopedic surgery   Other Consultants:  Physical therapy  IAnti-Infectives:   None    Leisa Lenz, MD  [facility] Pager [phone]  Time spent in minutes: 25 minutes  If 7PM-7AM, please contact night-coverage [email or web address] Password [email or web address] 05/14/2015, 11:00 AM   LOS: 2 days    HPI/Subjective: No acute overnight events. Patient reports pain improving but she has weakness.  Objective: Filed Vitals:   05/14/15 0004 05/14/15 0200 05/14/15 0600 05/14/15 1000  BP: 118/53 118/51 128/58 132/53  Pulse: 94 95 98 98  Temp: 98.3 F (36.8 C) 98.6 F (37 C) 99.1 F (37.3 C) 98.9 F (37.2 C)  TempSrc: Oral Oral Oral Oral  Resp: 15 14 16 16   Height:      Weight:      SpO2: 95% 96% 94% 93%    Intake/Output Summary (Last 24 hours) at 05/14/15 1100 Last data filed at 05/14/15 0830  Gross per 24 hour  Intake 3981.67 ml  Output   2125 ml  Net 1856.67 ml    Exam:   General:  Pt is alert, no distress  Cardiovascular: Regular rate and rhythm, S1/S2 appreciated  Respiratory: Bilateral air entry, no wheezing  Abdomen: Nontender, nondistended, appreciate bowel sounds  Extremities: No edema, dressing in place, pulses palpable  Neuro: No focal deficits  Data Reviewed: Basic Metabolic Panel:  Recent Labs Lab 05/12/15 1445 05/13/15 0457 05/14/15 0435  NA 139 140 140  K 3.7 3.7 3.6  CL 105 107 109  CO2 25 23 25   GLUCOSE 148* 113* 171*  BUN 14 19 19  CREATININE 0.73 0.99 0.86  CALCIUM 8.5* 8.2* 7.8*   Liver Function Tests:  Recent Labs Lab 05/12/15 1445  AST 31  ALT 25  ALKPHOS 98  BILITOT 0.7  PROT 8.1  ALBUMIN 5.0   No results for input(s): LIPASE, AMYLASE in the last 168 hours. No results for input(s): AMMONIA in the last 168 hours. CBC:  Recent Labs Lab 05/12/15 1445 05/13/15 0457 05/14/15 0435  WBC 16.9* 10.2 11.8*  NEUTROABS 15.5*  --   --   HGB 15.9* 14.7 12.6  HCT 46.6* 43.8  38.0  MCV 90.7 90.7 91.3  PLT 218 210 181   Cardiac Enzymes: No results for input(s): CKTOTAL, CKMB, CKMBINDEX, TROPONINI in the last 168 hours. BNP: Invalid input(s): POCBNP CBG: No results for input(s): GLUCAP in the last 168 hours.  Recent Results (from the past 240 hour(s))  Surgical pcr screen     Status: None   Collection Time: 05/12/15  1:03 PM  Result Value Ref Range Status   MRSA, PCR NEGATIVE NEGATIVE Final   Staphylococcus aureus NEGATIVE NEGATIVE Final     Scheduled Meds: . sodium chloride   Intravenous Once  . aspirin EC  325 mg Oral BID  . docusate sodium  100 mg Oral BID  . ferrous sulfate  325 mg Oral TID PC  . irbesartan  300 mg Oral Daily  . levothyroxine  100 mcg Oral QAC breakfast  . polyethylene glycol  17 g Oral BID  . senna  1 tablet Oral BID   Continuous Infusions: . sodium chloride 50 mL/hr at 05/14/15 [phone]

## 2015-05-14 NOTE — Progress Notes (Addendum)
CSW assisting with d/c planning. Pt has chosen [facility] for [employer]. Pt reports Dr.Olin is anticipating d/c on Monday. SNF will have a bed available on Monday for pt if stable for d/c and ST Rehab continues to be recommended.  RNCM will assist with [facility] services if pt is able to return home following hospital d/c. Pt is part of the medicare bundle program. CSW will continue to follow.  Martha Lean LCSW [phone]

## 2015-05-14 NOTE — Plan of Care (Signed)
Problem: Consults Goal: Diagnosis- Total Joint Replacement Outcome: Completed/Met Date Met:  05/14/15 Primary Total Hip RIGHT, Anterior

## 2015-05-14 NOTE — Evaluation (Signed)
Physical Therapy Evaluation Patient Details Name: Martha Rowe MRN: [id number] DOB: [birth year] Today's Date: 05/14/2015   History of Present Illness  s/p fall wtih R hip fx and THR (ant direct) to repair.  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  Pt would benefit from follow up rehab at SNF level to maximize IND and safety prior to return home with ltd assist.    Follow Up Recommendations SNF    Equipment Recommendations  None recommended by PT    Recommendations for Other Services OT consult     Precautions / Restrictions Precautions Precautions: Fall Restrictions Weight Bearing Restrictions: No RLE Weight Bearing: Weight bearing as tolerated      Mobility  Bed Mobility Overal bed mobility: Needs Assistance;+2 for physical assistance Bed Mobility: Supine to Sit     Supine to sit: Mod assist;+2 for physical assistance     General bed mobility comments: cues for sequence and use of L LE to self assist.  Physical assist to manage R LE and to bring trunk to upright  Transfers Overall transfer level: Needs assistance Equipment used: Rolling walker (2 wheeled) Transfers: Sit to/from Stand Sit to Stand: Mod assist;+2 physical assistance;+2 safety/equipment;From elevated surface         General transfer comment: cues for LE management and use of UEs to self assist.    Ambulation/Gait Ambulation/Gait assistance: Min assist;Mod assist;+2 safety/equipment;+2 physical assistance Ambulation Distance (Feet): 34 Feet Assistive device: Rolling walker (2 wheeled) Gait Pattern/deviations: Step-to pattern;Decreased step length - right;Decreased step length - left;Shuffle;Trunk flexed Gait velocity: decr   General Gait Details: cues for sequence, posture and position from RW.  Physical assist to manage RW and for  balance/support with one episode knees buckling and one episode balance loss posteriorly  Stairs            Wheelchair Mobility    Modified Rankin (Stroke Patients Only)       Balance                                             Pertinent Vitals/Pain Pain Assessment: 0-10 Pain Score: 4  Pain Location: R hip Pain Descriptors / Indicators: Aching;Sore Pain Intervention(s): Limited activity within patient's tolerance;Monitored during session;Premedicated before session;Ice applied    Home Living Family/patient expects to be discharged to:: Skilled nursing facility Living Arrangements: Spouse/significant other               Additional Comments: Pt states husband in poor condition and unable to physically assist    Prior Function Level of Independence: Independent               Hand Dominance        Extremity/Trunk Assessment   Upper Extremity Assessment: Generalized weakness           Lower Extremity Assessment: Generalized weakness;RLE deficits/detail RLE Deficits / Details: 2/5 strength with AAROM at hip to 80 flex and 20 abd       Communication   Communication: No difficulties  Cognition Arousal/Alertness: Awake/alert Behavior During Therapy: WFL for tasks assessed/performed Overall Cognitive Status: Within Functional Limits for tasks assessed  General Comments      Exercises Total Joint Exercises Ankle Circles/Pumps: AROM;Both;15 reps;Supine Quad Sets: AROM;Both;10 reps;Supine Heel Slides: AAROM;Right;15 reps;Supine Hip ABduction/ADduction: AAROM;Right;10 reps;Supine      Assessment/Plan    PT Assessment Patient needs continued PT services  PT Diagnosis Difficulty walking   PT Problem List Decreased strength;Decreased range of motion;Decreased activity tolerance;Decreased mobility;Decreased knowledge of use of DME;Obesity;Pain  PT Treatment Interventions DME instruction;Gait  training;Functional mobility training;Therapeutic activities;Therapeutic exercise;Patient/family education   PT Goals (Current goals can be found in the Care Plan section) Acute Rehab PT Goals Patient Stated Goal: Not trip over that cat again PT Goal Formulation: With patient Time For Goal Achievement: 05/21/15 Potential to Achieve Goals: Good    Frequency 7X/week   Barriers to discharge        Co-evaluation               End of Session Equipment Utilized During Treatment: Gait belt Activity Tolerance: Patient tolerated treatment well Patient left: in chair;with call bell/phone within reach Nurse Communication: Mobility status         Time: 1052-1130 PT Time Calculation (min) (ACUTE ONLY): 38 min   Charges:   PT Evaluation $Initial PT Evaluation Tier I: 1 Procedure PT Treatments $Gait Training: 8-22 mins $Therapeutic Exercise: 8-22 mins   PT G Codes:        Layia Walla May 19, 2015, 12:59 PM

## 2015-05-14 NOTE — Progress Notes (Signed)
Physical Therapy Treatment Patient Details Name: Martha Rowe MRN: [id number] DOB: October 31, 1940 Today's Date: 05/14/2015    History of Present Illness s/p fall wtih R hip fx and THR (ant direct) to repair.  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  Follow Up Recommendations  SNF     Equipment Recommendations  None recommended by PT    Recommendations for Other Services OT consult     Precautions / Restrictions Precautions Precautions: Fall Restrictions Weight Bearing Restrictions: No RLE Weight Bearing: Weight bearing as tolerated    Mobility  Bed Mobility Overal bed mobility: Needs Assistance;+2 for physical assistance Bed Mobility: Sit to Supine       Sit to supine: Mod assist   General bed mobility comments: cues for sequence and use of L LE to self assist.  Physical assist to manage Bil LE and to control trunk  Transfers Overall transfer level: Needs assistance Equipment used: Rolling walker (2 wheeled) Transfers: Sit to/from Stand Sit to Stand: Min assist;Mod assist;+2 physical assistance;+2 safety/equipment         General transfer comment: cues for LE management and use of UEs to self assist.    Ambulation/Gait Ambulation/Gait assistance: Min assist;Mod assist;+2 physical assistance;+2 safety/equipment Ambulation Distance (Feet): 36 Feet Assistive device: Rolling walker (2 wheeled) Gait Pattern/deviations: Step-to pattern;Decreased step length - right;Decreased step length - left;Shuffle;Trunk flexed Gait velocity: decr   General Gait Details: cues for sequence, posture and position from RW.  Physical assist to manage RW and for balance/support with one episode knees buckling and one episode balance loss posteriorly   Stairs            Wheelchair Mobility    Modified Rankin (Stroke Patients Only)       Balance                                     Cognition Arousal/Alertness: Awake/alert Behavior During Therapy: WFL for tasks assessed/performed Overall Cognitive Status: Within Functional Limits for tasks assessed                      Exercises      General Comments        Pertinent Vitals/Pain Pain Assessment: 0-10 Pain Score: 4  Pain Location: R hip Pain Descriptors / Indicators: Aching;Sore Pain Intervention(s): Limited activity within patient's tolerance;Monitored during session;Premedicated before session;Ice applied    Home Living                      Prior Function            PT Goals (current goals can now be found in the care plan section) Acute Rehab PT Goals Patient Stated Goal: Not trip over that cat again PT Goal Formulation: With patient Time For Goal Achievement: 05/21/15 Potential to Achieve Goals: Good Progress towards PT goals: Progressing toward goals    Frequency  7X/week    PT Plan Current plan remains appropriate    Co-evaluation             End of Session Equipment Utilized During Treatment: Gait belt Activity Tolerance: Patient tolerated treatment well Patient left: in bed;with call bell/phone within reach;with family/visitor present     Time: 2297-9892 PT Time Calculation (min) (ACUTE ONLY): 15  min  Charges:  $Gait Training: 8-22 mins                    G Codes:      Taylan Mayhan Jun 02, 2015, 4:32 PM

## 2015-05-15 LAB — BASIC METABOLIC PANEL
Anion gap: 7 (ref 5–15)
BUN: 22 mg/dL — ABNORMAL HIGH (ref 6–20)
CALCIUM: 7.8 mg/dL — AB (ref 8.9–10.3)
CHLORIDE: 109 mmol/L (ref 101–111)
CO2: 25 mmol/L (ref 22–32)
Creatinine, Ser: 0.84 mg/dL (ref 0.44–1.00)
GFR calc non Af Amer: >60 mL/min (ref >60)
GLUCOSE: 104 mg/dL — AB (ref 65–99)
Potassium: 3.4 mmol/L — ABNORMAL LOW (ref 3.5–5.1)
SODIUM: 141 mmol/L (ref 135–145)

## 2015-05-15 LAB — CBC
HCT: 31 % — ABNORMAL LOW (ref 36.0–46.0)
Hemoglobin: 10.3 g/dL — ABNORMAL LOW (ref 12.0–15.0)
MCH: 30.7 pg (ref 26.0–34.0)
MCHC: 33.2 g/dL (ref 30.0–36.0)
MCV: 92.3 fL (ref 78.0–100.0)
PLATELETS: 168 10*3/uL (ref 150–400)
RBC: 3.36 MIL/uL — AB (ref 3.87–5.11)
RDW: 13.8 % (ref 11.5–15.5)
WBC: 11 10*3/uL — AB (ref 4.0–10.5)

## 2015-05-15 MED ORDER — POTASSIUM CHLORIDE CRYS ER 20 MEQ PO TBCR
40.0000 meq | EXTENDED_RELEASE_TABLET | Freq: Once | ORAL | Status: AC
  Administered 2015-05-15: 40 meq via ORAL
  Filled 2015-05-15: qty 2

## 2015-05-15 NOTE — Progress Notes (Addendum)
Patient ID: Martha Rowe, female   DOB: 08-04-1941, 74 y.o.   MRN: [id number] [facility] PROGRESS NOTE  Martha Rowe DVV:[id number] DOB: 1941/06/17 DOA: 05/12/2015 PCP: Dr. Ernestene Kiel (from [place])   Brief narrative:    74 y.o. female with past medical history of hypothyroidism, hypertension who presented to [facility] ED status post fall. She has sustained right femoral neck fracture.   Barrier to discharge: s/p right hip arthroplasty 05/13/2015. To SNF 05/17/2015.  Assessment/Plan:    Active Problems: Hip fracture requiring operative repair  - Status post mechanical fall - X ray demonstrated displaced fracture within the subcapital region of the right femoral neck  - S/P  total right hip arthroplasty 7/28 - PT evaluation in progress - Continue aspirin 325 mg for 4 weeks - To SNF monday  Leukocytosis - Likely reactive  - No evidence of infection  Essential hypernatremia - Continue avapro  Hypothyroidism  - Continue synthroid   Iron deficiency anemia - Continue ferrous sulfate supplementation   Constipation - Continue miralax and senna  DVT Prophylaxis  - Heparin subQ while pt in hospital    Code Status: Full.  Family Communication:  plan of care discussed with the patient Disposition Plan: Likely skilled nursing facility by Monday, 05/17/2015  IV access:  Peripheral IV  Procedures and diagnostic studies:    Dg Chest Portable 1 View 05/12/2015 Chronic postop findings from prior right lung surgery.  Borderline cardiomegaly and vascular congestion  Aortic atherosclerosis  No superimposed acute process   Electronically Signed   By: Jerilynn Mages.  Shick M.D.   On: 05/12/2015 14:29   Dg Hip Unilat  With Pelvis 2-3 Views Right 05/12/2015 Displaced fracture within the subcapital region of the right femoral neck as detailed above. Right femoral head remains well positioned relative to the acetabulum.    Medical Consultants:  Orthopedic surgery   Other Consultants:  Physical  therapy  IAnti-Infectives:   None    Leisa Lenz, MD  [facility] Pager [phone]  Time spent in minutes: 15 minutes  If 7PM-7AM, please contact night-coverage [email or web address] Password [email or web address] 05/15/2015, 11:01 AM   LOS: 3 days    HPI/Subjective: No acute overnight events. Patient reports pain is controlled.  Objective: Filed Vitals:   05/14/15 1000 05/14/15 1400 05/14/15 1848 05/14/15 2200  BP: 132/53 114/48 134/43 137/59  Pulse: 98 87 98 97  Temp: 98.9 F (37.2 C) 98.7 F (37.1 C) 98.7 F (37.1 C) 98.3 F (36.8 C)  TempSrc: Oral Oral Oral Oral  Resp: 16 16 16 16   Height:      Weight:      SpO2: 93% 91% 91% 91%    Intake/Output Summary (Last 24 hours) at 05/15/15 1101 Last data filed at 05/15/15 1045  Gross per 24 hour  Intake 2757.5 ml  Output   2050 ml  Net  707.5 ml    Exam:   General:  Pt is alert, no acute distress  Cardiovascular: Rate controlled, (+) S1S2  Respiratory: clear to auscultation bilaterally, no wheezing  Abdomen: (+) BS, non tender, non distended  Extremities: No leg swelling, pulses palpable  Neuro: Non focal   Data Reviewed: Basic Metabolic Panel:  Recent Labs Lab 05/12/15 1445 05/13/15 0457 05/14/15 0435 05/15/15 0520  NA 139 140 140 141  K 3.7 3.7 3.6 3.4*  CL 105 107 109 109  CO2 25 23 25 25   GLUCOSE 148* 113* 171* 104*  BUN 14 19 19  22*  CREATININE 0.73 0.99 0.86 0.84  CALCIUM 8.5*  8.2* 7.8* 7.8*   Liver Function Tests:  Recent Labs Lab 05/12/15 1445  AST 31  ALT 25  ALKPHOS 98  BILITOT 0.7  PROT 8.1  ALBUMIN 5.0   No results for input(s): LIPASE, AMYLASE in the last 168 hours. No results for input(s): AMMONIA in the last 168 hours. CBC:  Recent Labs Lab 05/12/15 1445 05/13/15 0457 05/14/15 0435 05/15/15 0520  WBC 16.9* 10.2 11.8* 11.0*  NEUTROABS 15.5*  --   --   --   HGB 15.9* 14.7 12.6 10.3*  HCT 46.6* 43.8 38.0 31.0*  MCV 90.7 90.7 91.3 92.3  PLT 218 210 181 168   Cardiac  Enzymes: No results for input(s): CKTOTAL, CKMB, CKMBINDEX, TROPONINI in the last 168 hours. BNP: Invalid input(s): POCBNP CBG: No results for input(s): GLUCAP in the last 168 hours.  Recent Results (from the past 240 hour(s))  Surgical pcr screen     Status: None   Collection Time: 05/12/15  1:03 PM  Result Value Ref Range Status   MRSA, PCR NEGATIVE NEGATIVE Final   Staphylococcus aureus NEGATIVE NEGATIVE Final     Scheduled Meds: . sodium chloride   Intravenous Once  . aspirin EC  325 mg Oral BID  . docusate sodium  100 mg Oral BID  . ferrous sulfate  325 mg Oral TID PC  . irbesartan  300 mg Oral Daily  . levothyroxine  100 mcg Oral QAC breakfast  . polyethylene glycol  17 g Oral BID  . potassium chloride  40 mEq Oral Once  . senna  1 tablet Oral BID   Continuous Infusions: . sodium chloride 50 mL/hr at 05/14/15 [phone]

## 2015-05-15 NOTE — Progress Notes (Signed)
Jalicia Roszak  MRN: [id number] DOB/Age: [birth year] 74 y.o. Physician: Rada Hay Procedure: Procedure(s) (LRB): TOTAL HIP ARTHROPLASTY ANTERIOR APPROACH (Right)     Subjective: Eating well, keeping food down , still has 2 IV access with one running. Slow with PT and plan is SNF Monday at [place]:  [98.3 F (36.8 C)-98.9 F (37.2 C)] 98.3 F (36.8 C) (07/29 2200) Pulse Rate:  [87-98] 97 (07/29 2200) Resp:  [16] 16 (07/29 2200) BP: (114-137)/(43-59) 137/59 mmHg (07/29 2200) SpO2:  [91 %-93 %] 91 % (07/29 2200)  Lab Results  Recent Labs  05/14/15 0435 05/15/15 0520  WBC 11.8* 11.0*  HGB 12.6 10.3*  HCT 38.0 31.0*  PLT 181 168   BMET  Recent Labs  05/14/15 0435 05/15/15 0520  NA 140 141  K 3.6 3.4*  CL 109 109  CO2 25 25  GLUCOSE 171* 104*  BUN 19 22*  CREATININE 0.86 0.84  CALCIUM 7.8* 7.8*   INR  Date Value Ref Range Status  05/12/2015 1.06 0.00 - 1.49 Final     Exam Hip dressing dry        Plan Discontinue IV PT/OT and plan Dc to skilled Monday  [facility] for Dr.Olin 05/15/2015, 8:52 AM Contact # [phone]

## 2015-05-15 NOTE — Progress Notes (Signed)
OT Cancellation Note  Patient Details Name: Martha Rowe MRN: [id number] DOB: 15-Jun-1941   Cancelled Treatment:    Noted plan for SNF- will defer OT eval to SNF Dajah Fischman, Thereasa Parkin 05/15/2015, 8:39 AM

## 2015-05-15 NOTE — Progress Notes (Signed)
Physical Therapy Treatment Patient Details Name: Martha Rowe MRN: [id number] DOB: June 23, 1941 Today's Date: 05/15/2015    History of Present Illness s/p fall wtih R hip fx and THR (ant direct) to repair.  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  Follow Up Recommendations  SNF     Equipment Recommendations  None recommended by PT    Recommendations for Other Services OT consult     Precautions / Restrictions Precautions Precautions: Fall Restrictions Weight Bearing Restrictions: No RLE Weight Bearing: Weight bearing as tolerated    Mobility  Bed Mobility Overal bed mobility: Needs Assistance;+2 for physical assistance Bed Mobility: Supine to Sit     Supine to sit: Mod assist;+2 for physical assistance     General bed mobility comments: cues for sequence and use of L LE to self assist.  Physical assist to manage R LE and to control trunk  Transfers Overall transfer level: Needs assistance Equipment used: Rolling walker (2 wheeled) Transfers: Sit to/from Stand Sit to Stand: Min assist;Mod assist;From elevated surface         General transfer comment: cues for LE management and use of UEs to self assist.    Ambulation/Gait Ambulation/Gait assistance: Min assist;Mod assist Ambulation Distance (Feet): 58 Feet Assistive device: Rolling walker (2 wheeled) Gait Pattern/deviations: Step-to pattern;Decreased step length - right;Decreased step length - left;Shuffle;Trunk flexed Gait velocity: decr   General Gait Details: cues for sequence, posture and position from RW.  Physical assist to manage RW and for balance/support with one episode balance loss posteriorly   Stairs            Wheelchair Mobility    Modified Rankin (Stroke Patients Only)       Balance                                    Cognition Arousal/Alertness: Awake/alert Behavior During  Therapy: WFL for tasks assessed/performed Overall Cognitive Status: Within Functional Limits for tasks assessed                      Exercises Total Joint Exercises Ankle Circles/Pumps: AROM;Both;15 reps;Supine Quad Sets: AROM;Both;10 reps;Supine Heel Slides: AAROM;Right;Supine;20 reps Hip ABduction/ADduction: AAROM;Right;Supine;15 reps    General Comments        Pertinent Vitals/Pain Pain Assessment: 0-10 Pain Score: 5  Pain Location: R hip Pain Descriptors / Indicators: Aching;Sore Pain Intervention(s): Limited activity within patient's tolerance;Monitored during session;Premedicated before session;Ice applied    Home Living                      Prior Function            PT Goals (current goals can now be found in the care plan section) Acute Rehab PT Goals Patient Stated Goal: Walk on my own again PT Goal Formulation: With patient Time For Goal Achievement: 05/21/15 Potential to Achieve Goals: Good Progress towards PT goals: Progressing toward goals    Frequency  7X/week    PT Plan Current plan remains appropriate    Co-evaluation             End of Session Equipment Utilized During Treatment: Gait belt Activity Tolerance: Patient tolerated treatment well Patient left: in chair;with call bell/phone within reach     Time: 7948-0165 PT Time  Calculation (min) (ACUTE ONLY): 27 min  Charges:  $Gait Training: 8-22 mins $Therapeutic Exercise: 8-22 mins                    G Codes:      Martha Rowe 17-May-2015, 11:03 AM

## 2015-05-16 DIAGNOSIS — S72001A Fracture of unspecified part of neck of right femur, initial encounter for closed fracture: Secondary | ICD-10-CM | POA: Insufficient documentation

## 2015-05-16 DIAGNOSIS — D72829 Elevated white blood cell count, unspecified: Secondary | ICD-10-CM | POA: Insufficient documentation

## 2015-05-16 DIAGNOSIS — S72001S Fracture of unspecified part of neck of right femur, sequela: Secondary | ICD-10-CM

## 2015-05-16 DIAGNOSIS — E039 Hypothyroidism, unspecified: Secondary | ICD-10-CM | POA: Insufficient documentation

## 2015-05-16 LAB — BASIC METABOLIC PANEL
ANION GAP: 11 (ref 5–15)
BUN: 14 mg/dL (ref 6–20)
CHLORIDE: 105 mmol/L (ref 101–111)
CO2: 24 mmol/L (ref 22–32)
Calcium: 8.6 mg/dL — ABNORMAL LOW (ref 8.9–10.3)
Creatinine, Ser: 0.7 mg/dL (ref 0.44–1.00)
Glucose, Bld: 119 mg/dL — ABNORMAL HIGH (ref 65–99)
POTASSIUM: 3.5 mmol/L (ref 3.5–5.1)
Sodium: 140 mmol/L (ref 135–145)

## 2015-05-16 LAB — CBC
HCT: 36.8 % (ref 36.0–46.0)
Hemoglobin: 12.3 g/dL (ref 12.0–15.0)
MCH: 30.3 pg (ref 26.0–34.0)
MCHC: 33.4 g/dL (ref 30.0–36.0)
MCV: 90.6 fL (ref 78.0–100.0)
PLATELETS: 192 10*3/uL (ref 150–400)
RBC: 4.06 MIL/uL (ref 3.87–5.11)
RDW: 13.5 % (ref 11.5–15.5)
WBC: 10.7 10*3/uL — AB (ref 4.0–10.5)

## 2015-05-16 MED ORDER — DOCUSATE SODIUM 100 MG PO CAPS: 100.0000 mg | ORAL_CAPSULE | Freq: Two times a day (BID) | ORAL | Status: AC

## 2015-05-16 NOTE — Progress Notes (Signed)
Patient ID: Martha Rowe, female   DOB: 1941/01/17, 74 y.o.   MRN: [id number]    Subjective: 3 Days Post-Op Procedure(s) (LRB): TOTAL HIP ARTHROPLASTY ANTERIOR APPROACH (Right) Patient reports pain as 2 on 0-10 scale and mild.   Denies CP or SOB.  Voiding without difficulty. Positive flatus. Objective: Vital signs in last 24 hours: Temp:  [98.4 F (36.9 C)-98.8 F (37.1 C)] 98.8 F (37.1 C) (07/31 0430) Pulse Rate:  [95-101] 99 (07/31 0430) Resp:  [18-20] 20 (07/31 0430) BP: (115-163)/(46-88) 162/88 mmHg (07/31 0430) SpO2:  [92 %-95 %] 95 % (07/31 0430)  Intake/Output from previous day: 07/30 0701 - 07/31 0700 In: 1197.5 [P.O.:360; I.V.:837.5] Out: 400 [Urine:400] Intake/Output this shift:    Labs:  Recent Labs  05/14/15 0435 05/15/15 0520 05/16/15 0508  HGB 12.6 10.3* 12.3    Recent Labs  05/15/15 0520 05/16/15 0508  WBC 11.0* 10.7*  RBC 3.36* 4.06  HCT 31.0* 36.8  PLT 168 192    Recent Labs  05/15/15 0520 05/16/15 0508  NA 141 140  K 3.4* 3.5  CL 109 105  CO2 25 24  BUN 22* 14  CREATININE 0.84 0.70  GLUCOSE 104* 119*  CALCIUM 7.8* 8.6*   No results for input(s): LABPT, INR in the last 72 hours.  Physical Exam: Neurologically intact ABD soft Neurovascular intact Sensation intact distally Intact pulses distally Dorsiflexion/Plantar flexion intact Incision: no drainage No cellulitis present Compartment soft  Assessment/Plan: 3 Days Post-Op Procedure(s) (LRB): TOTAL HIP ARTHROPLASTY ANTERIOR APPROACH (Right) Advance diet  Pt may DC tomorrow if doing well Continue ambulation Labs reviewed  Aylene Acoff, Darla Lesches for Dr. Melina Schools [facility] [phone] 05/16/2015, 8:13 AM

## 2015-05-16 NOTE — Progress Notes (Signed)
Physical Therapy Treatment Patient Details Name: Martha Rowe MRN: [id number] DOB: 1941-02-17 Today's Date: 05/16/2015    History of Present Illness s/p fall wtih R hip fx and THR (ant direct) to repair.  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  Assisted OOB to amb to BR then amb in hallway.  Returned to room then performed TE's followed by ICE.    Follow Up Recommendations  SNF ([facility])     Equipment Recommendations       Recommendations for Other Services       Precautions / Restrictions Precautions Precautions: Fall Restrictions Weight Bearing Restrictions: No RLE Weight Bearing: Weight bearing as tolerated    Mobility  Bed Mobility Overal bed mobility: Needs Assistance;+2 for physical assistance Bed Mobility: Supine to Sit     Supine to sit: Mod assist     General bed mobility comments: cues for sequence and use of L LE to self assist.  Physical assist to manage R LE and to control trunk  Transfers Overall transfer level: Needs assistance Equipment used: Rolling walker (2 wheeled) Transfers: Sit to/from Stand Sit to Stand: Min assist;Mod assist;From elevated surface         General transfer comment: cues for LE management and use of UEs to self assist.  assisted on/off commode with 25% VC's on proper hand placement.  Ambulation/Gait Ambulation/Gait assistance: Min assist Ambulation Distance (Feet): 45 Feet Assistive device: Rolling walker (2 wheeled) Gait Pattern/deviations: Step-to pattern;Decreased step length - right;Decreased step length - left;Decreased stance time - right;Trunk flexed     General Gait Details: cues for sequence, posture and position from RW.  Increased time and safety with turns.   Stairs            Wheelchair Mobility    Modified Rankin (Stroke Patients Only)       Balance                                    Cognition  Arousal/Alertness: Awake/alert Behavior During Therapy: WFL for tasks assessed/performed Overall Cognitive Status: Within Functional Limits for tasks assessed                      Exercises   Total Hip Replacement TE's 10 reps ankle pumps 10 reps knee presses 10 reps heel slides 10 reps SAQ's 10 reps ABD Followed by ICE     General Comments        Pertinent Vitals/Pain Pain Assessment: 0-10 Pain Score: 5  Pain Location: R hip Pain Descriptors / Indicators: Aching;Sore Pain Intervention(s): Monitored during session;Repositioned;Ice applied    Home Living                      Prior Function            PT Goals (current goals can now be found in the care plan section) Progress towards PT goals: Progressing toward goals    Frequency  7X/week    PT Plan Current plan remains appropriate    Co-evaluation             End of Session Equipment Utilized During Treatment: Gait belt Activity Tolerance: Patient tolerated treatment well Patient left: in chair;with call bell/phone within reach     Time: 0915-0940 PT Time Calculation (  min) (ACUTE ONLY): 25 min  Charges:  $Gait Training: 8-22 mins $Therapeutic Exercise: 8-22 mins                    G Codes:      Rica Koyanagi  PTA [facility]  Acute  Rehab Pager      [phone]

## 2015-05-16 NOTE — Discharge Summary (Signed)
Physician Discharge Summary  Martha Rowe NID:[id number] DOB: March 17, 1941 DOA: 05/12/2015  PCP: Dr. Ernestene Kiel (from [place])  [place] date: 05/12/2015 Discharge date: 05/17/2015  Recommendations for Outpatient Follow-up:  1. Patient will continue aspirin for 4 weeks on discharge 2. She will follow up with ortho; ortho office to scheduled appointment for the patient for follow up   Discharge Diagnoses:  Active Problems:   Hip fracture requiring operative repair   Essential hypernatremia   Hypothyroidism (acquired)    Discharge Condition: stable   Diet recommendation: as tolerated   History of present illness:  74 y.o. female with past medical history of hypothyroidism, hypertension who presented to [facility] ED status post fall. She has sustained right femoral neck fracture. Patient is s/p right hip arthroplasty 05/13/2015. To SNF 05/17/2015.   Hospital Course:   Assessment/Plan:    Active Problems: Hip fracture requiring operative repair  - Status post mechanical fall - X ray demonstrated displaced fracture within the subcapital region of the right femoral neck  - S/P  total right hip arthroplasty 7/28 without subsequent complications  - Continue aspirin 325 mg for 4 weeks - Per PT - SNF placement   Leukocytosis - Likely reactive  - No evidence of infection  Essential hypernatremia - Continue avapro  Hypothyroidism  - Continue synthroid   Constipation - Continue coalce on discharge   DVT Prophylaxis  - Heparin subQ while pt in hospital    Code Status: Full.  Family Communication: plan of care discussed with the patient  IV access:  Peripheral IV  Procedures and diagnostic studies:   Dg Chest Portable 1 View 05/12/2015 Chronic postop findings from prior right lung surgery. Borderline cardiomegaly and vascular congestion Aortic atherosclerosis No superimposed acute process Electronically Signed By: Jerilynn Mages. Shick M.D. On: 05/12/2015 14:29   Dg  Hip Unilat With Pelvis 2-3 Views Right 05/12/2015 Displaced fracture within the subcapital region of the right femoral neck as detailed above. Right femoral head remains well positioned relative to the acetabulum.   Medical Consultants:  Orthopedic surgery   Other Consultants:  Physical therapy  IAnti-Infectives:   None    Signed:  Leisa Lenz, MD  [facility] 05/16/2015, 9:58 AM  Pager #: [phone]  Time spent in minutes: less than 30 minutes   Discharge Exam: Filed Vitals:   05/16/15 0430  BP: 162/88  Pulse: 99  Temp: 98.8 F (37.1 C)  Resp: 20   Filed Vitals:   05/14/15 2200 05/15/15 1405 05/15/15 2125 05/16/15 0430  BP: 137/59 115/46 163/70 162/88  Pulse: 97 95 101 99  Temp: 98.3 F (36.8 C) 98.6 F (37 C) 98.4 F (36.9 C) 98.8 F (37.1 C)  TempSrc: Oral Oral Oral Oral  Resp: 16 18 18 20   Height:      Weight:      SpO2: 91% 93% 92% 95%    General: Pt is alert, follows commands appropriately, not in acute distress Cardiovascular: Regular rate and rhythm, S1/S2 +, no murmurs Respiratory: Clear to auscultation bilaterally, no wheezing, no crackles, no rhonchi Abdominal: Soft, non tender, non distended, bowel sounds +, no guarding Extremities: no cyanosis, pulses palpable bilaterally Neuro: Grossly nonfocal  Discharge Instructions  Discharge Instructions    Call MD for:  persistant dizziness or light-headedness    Complete by:  As directed      Call MD for:  persistant nausea and vomiting    Complete by:  As directed      Call MD for:  redness, tenderness, or  signs of infection (pain, swelling, redness, odor or green/yellow discharge around incision site)    Complete by:  As directed      Call MD for:  temperature >100.4    Complete by:  As directed      Diet - low sodium heart healthy    Complete by:  As directed      Increase activity slowly    Complete by:  As directed      Weight bearing as tolerated    Complete by:  As  directed   Laterality:  right  Extremity:  Lower            Medication List    TAKE these medications        aspirin EC 325 MG tablet  Take 1 tablet (325 mg total) by mouth 2 (two) times daily. Take for 4 weeks.     docusate sodium 100 MG capsule  Commonly known as:  COLACE  Take 1 capsule (100 mg total) by mouth 2 (two) times daily.     HYDROcodone-acetaminophen 7.5-325 MG per tablet  Commonly known as:  NORCO  Take 1-2 tablets by mouth every 4 (four) hours as needed for moderate pain.     levothyroxine 100 MCG tablet  Commonly known as:  SYNTHROID, LEVOTHROID  Take 100 mcg by mouth daily before breakfast.     tiZANidine 4 MG tablet  Commonly known as:  ZANAFLEX  Take 1 tablet (4 mg total) by mouth every 6 (six) hours as needed for muscle spasms.     valsartan 320 MG tablet  Commonly known as:  DIOVAN  Take 320 mg by mouth daily.           Follow-up Information    Follow up with Mauri Pole, MD. Schedule an appointment as soon as possible for a visit in 2 weeks.   Specialty:  Orthopedic Surgery   Contact information:   [address] [place] [address] [phone]        The results of significant diagnostics from this hospitalization (including imaging, microbiology, ancillary and laboratory) are listed below for reference.    Significant Diagnostic Studies: Dg Chest Portable 1 View  05/12/2015   CLINICAL DATA:  Preop evaluation for hip surgery. Previous lung cancer.  EXAM: PORTABLE CHEST - 1 VIEW  COMPARISON:  None available  FINDINGS: Postop changes in the right hilum, suspect prior right upper lobectomy. Borderline heart size with central vascular prominence. No focal pneumonia, collapse or consolidation. Negative for edema, effusion or pneumothorax. Bones are osteopenic. Prior partial rib resection of the right posterior eighth rib. Atherosclerosis noted of the aorta. Trachea is midline.  IMPRESSION: Chronic postop findings from  prior right lung surgery.  Borderline cardiomegaly and vascular congestion  Aortic atherosclerosis  No superimposed acute process   Electronically Signed   By: Jerilynn Mages.  Shick M.D.   On: 05/12/2015 14:29   Dg C-arm 1-60 Min-no Report  05/13/2015   CLINICAL DATA: surgery   C-ARM 1-60 MINUTES  Fluoroscopy was utilized by the requesting physician.  No radiographic  interpretation.    Dg Hip Port Unilat With Pelvis 1v Right  05/13/2015   CLINICAL DATA:  Hip fracture, operative fixation  EXAM: DG C-ARM 1-60 MIN - NRPT [facility]; DG HIP (WITH OR WITHOUT PELVIS) 1V PORT RIGHT  COMPARISON:  05/12/2015  FINDINGS: Portable pelvis and cross-table lateral views demonstrate right hip arthroplasty. Components appear alignment. No complicating feature or acute osseous finding. Bones are osteopenic. Expected  postoperative changes of the right hip soft tissues.  IMPRESSION: Status post right hip arthroplasty.  No complicating feature.  Osteopenia   Electronically Signed   By: Jerilynn Mages.  Shick M.D.   On: 05/13/2015 20:10   Dg Hip Unilat  With Pelvis 2-3 Views Right  05/12/2015   CLINICAL DATA:  Status post fall, pain lower back and right hip.  EXAM: DG HIP (WITH OR WITHOUT PELVIS) 2-3V RIGHT  COMPARISON:  None.  FINDINGS: There is a displaced fracture within the subcapital region of the right femoral neck with associated mild angulation deformity and probable mild impaction at the fracture site. Small avulsion fracture fragment noted medially. Right femoral head remains well positioned relative to the acetabulum.  Osseous structures of the adjacent pelvis appear intact and well aligned. Suspect mild degenerative change in the lower lumbar spine. Soft tissues about the pelvis and right hip are unremarkable.  IMPRESSION: Displaced fracture within the subcapital region of the right femoral neck as detailed above. Right femoral head remains well positioned relative to the acetabulum.  These results were called by telephone at the time of  interpretation on 05/12/2015 at 2:01 pm to Dr. Shirlyn Goltz , who verbally acknowledged these results.   Electronically Signed   By: Franki Cabot M.D.   On: 05/12/2015 14:04    Microbiology: Recent Results (from the past 240 hour(s))  Surgical pcr screen     Status: None   Collection Time: 05/12/15  1:03 PM  Result Value Ref Range Status   MRSA, PCR NEGATIVE NEGATIVE Final   Staphylococcus aureus NEGATIVE NEGATIVE Final    Comment:        The Xpert SA Assay (FDA approved for NASAL specimens in patients over 67 years of age), is one component of a comprehensive surveillance program.  Test performance has been validated by [facility] for patients greater than or equal to 61 year old. It is not intended to diagnose infection nor to guide or monitor treatment.      Labs: Basic Metabolic Panel:  Recent Labs Lab 05/12/15 1445 05/13/15 0457 05/14/15 0435 05/15/15 0520 05/16/15 0508  NA 139 140 140 141 140  K 3.7 3.7 3.6 3.4* 3.5  CL 105 107 109 109 105  CO2 25 23 25 25 24   GLUCOSE 148* 113* 171* 104* 119*  BUN 14 19 19  22* 14  CREATININE 0.73 0.99 0.86 0.84 0.70  CALCIUM 8.5* 8.2* 7.8* 7.8* 8.6*   Liver Function Tests:  Recent Labs Lab 05/12/15 1445  AST 31  ALT 25  ALKPHOS 98  BILITOT 0.7  PROT 8.1  ALBUMIN 5.0   No results for input(s): LIPASE, AMYLASE in the last 168 hours. No results for input(s): AMMONIA in the last 168 hours. CBC:  Recent Labs Lab 05/12/15 1445 05/13/15 0457 05/14/15 0435 05/15/15 0520 05/16/15 0508  WBC 16.9* 10.2 11.8* 11.0* 10.7*  NEUTROABS 15.5*  --   --   --   --   HGB 15.9* 14.7 12.6 10.3* 12.3  HCT 46.6* 43.8 38.0 31.0* 36.8  MCV 90.7 90.7 91.3 92.3 90.6  PLT 218 210 181 168 192   Cardiac Enzymes: No results for input(s): CKTOTAL, CKMB, CKMBINDEX, TROPONINI in the last 168 hours. BNP: BNP (last 3 results) No results for input(s): BNP in the last 8760 hours.  ProBNP (last 3 results) No results for input(s): PROBNP in  the last 8760 hours.  CBG: No results for input(s): GLUCAP in the last 168 hours.

## 2015-05-17 NOTE — Progress Notes (Signed)
[facility] PROGRESS NOTE Assessment/Plan: Closed right hip fracture On ASA. Awaiting placement to SNF.  Thyroid activity decreased - recheck TSH in 6 weeks.  Leukocytosis 0 resolved, reactive.  Essential hypernatremia - cont avapro.  now resolved.   Code Status: Full.  Family Communication: plan of care discussed with the patient Disposition Plan: Likely skilled nursing facility todays   Consultants:  ortho   Antibiotics:  None  HPI/Subjective: No complains  Objective: Filed Vitals:   05/16/15 0430 05/16/15 1500 05/16/15 2200 05/17/15 0630  BP: 162/88 144/60 140/68 138/66  Pulse: 99 91 101 89  Temp: 98.8 F (37.1 C) 98.6 F (37 C) 98.8 F (37.1 C) 98.6 F (37 C)  TempSrc: Oral Oral Oral Oral  Resp: 20 18 18 18   Height:      Weight:      SpO2: 95% 94% 93% 94%    Intake/Output Summary (Last 24 hours) at 05/17/15 0752 Last data filed at 05/16/15 2200  Gross per 24 hour  Intake    840 ml  Output      0 ml  Net    840 ml   Filed Weights   05/12/15 1321 05/12/15 1601  Weight: 79.379 kg (175 lb) 79.379 kg (175 lb)    Exam:  General: Alert, awake, oriented x3, in no acute distress.  HEENT: No bruits, no goiter.  Heart: Regular rate and rhythm. Lungs: Good air movement, clear Abdomen: Soft, nontender, nondistended, positive bowel sounds.  Neuro: Grossly intact, nonfocal.   Data Reviewed: Basic Metabolic Panel:  Recent Labs Lab 05/12/15 1445 05/13/15 0457 05/14/15 0435 05/15/15 0520 05/16/15 0508  NA 139 140 140 141 140  K 3.7 3.7 3.6 3.4* 3.5  CL 105 107 109 109 105  CO2 25 23 25 25 24   GLUCOSE 148* 113* 171* 104* 119*  BUN 14 19 19  22* 14  CREATININE 0.73 0.99 0.86 0.84 0.70  CALCIUM 8.5* 8.2* 7.8* 7.8* 8.6*   Liver Function Tests:  Recent Labs Lab 05/12/15 1445  AST 31  ALT 25  ALKPHOS 98  BILITOT 0.7  PROT 8.1  ALBUMIN 5.0   No results for input(s): LIPASE, AMYLASE in the last 168 hours. No results for  input(s): AMMONIA in the last 168 hours. CBC:  Recent Labs Lab 05/12/15 1445 05/13/15 0457 05/14/15 0435 05/15/15 0520 05/16/15 0508  WBC 16.9* 10.2 11.8* 11.0* 10.7*  NEUTROABS 15.5*  --   --   --   --   HGB 15.9* 14.7 12.6 10.3* 12.3  HCT 46.6* 43.8 38.0 31.0* 36.8  MCV 90.7 90.7 91.3 92.3 90.6  PLT 218 210 181 168 192   Cardiac Enzymes: No results for input(s): CKTOTAL, CKMB, CKMBINDEX, TROPONINI in the last 168 hours. BNP (last 3 results) No results for input(s): BNP in the last 8760 hours.  ProBNP (last 3 results) No results for input(s): PROBNP in the last 8760 hours.  CBG: No results for input(s): GLUCAP in the last 168 hours.  Recent Results (from the past 240 hour(s))  Surgical pcr screen     Status: None   Collection Time: 05/12/15  1:03 PM  Result Value Ref Range Status   MRSA, PCR NEGATIVE NEGATIVE Final   Staphylococcus aureus NEGATIVE NEGATIVE Final    Comment:        The Xpert SA Assay (FDA approved for NASAL specimens in patients over 45 years of age), is one component of a comprehensive surveillance program.  Test performance has been validated by [facility] for patients greater than or equal to 3 year old. It is not intended to diagnose infection nor to guide or monitor treatment.      Studies: No results found.  Scheduled Meds: . sodium chloride   Intravenous Once  . aspirin EC  325 mg Oral BID  . docusate sodium  100 mg Oral BID  . ferrous sulfate  325 mg Oral TID PC  . irbesartan  300 mg Oral Daily  . levothyroxine  100 mcg Oral QAC breakfast  . polyethylene glycol  17 g Oral BID  . senna  1 tablet Oral BID   Continuous Infusions: . sodium chloride 50 mL/hr at 05/14/15 0528    Time Spent: 15 min   Charlynne Cousins  [facility] Pager [phone]. If 7PM-7AM, please contact night-coverage at [email or web address], password [email or web address] 05/17/2015, 7:52 AM  LOS: 5 days

## 2015-05-17 NOTE — Progress Notes (Signed)
Patient discharged to [facility] with family via private vehicle. Patient verbalizes understanding of discharge instructions, has packet and personal belongings. PIV removed. Report called to [place].

## 2015-05-17 NOTE — Progress Notes (Signed)
Patient ID: Martha Rowe, female   DOB: Apr 23, 1941, 74 y.o.   MRN: [id number] Subjective: 4 Days Post-Op Procedure(s) (LRB): TOTAL HIP ARTHROPLASTY ANTERIOR APPROACH (Right)    Patient reports pain as mild to moderate, comfortable in bed this am with family around  Objective:   VITALS:   Filed Vitals:   05/17/15 0630  BP: 138/66  Pulse: 89  Temp: 98.6 F (37 C)  Resp: 18    Neurovascular intact Incision: dressing C/D/I  LABS  Recent Labs  05/15/15 0520 05/16/15 0508  HGB 10.3* 12.3  HCT 31.0* 36.8  WBC 11.0* 10.7*  PLT 168 192     Recent Labs  05/15/15 0520 05/16/15 0508  NA 141 140  K 3.4* 3.5  BUN 22* 14  CREATININE 0.84 0.70  GLUCOSE 104* 119*    No results for input(s): LABPT, INR in the last 72 hours.   Assessment/Plan: 4 Days Post-Op Procedure(s) (LRB): TOTAL HIP ARTHROPLASTY ANTERIOR APPROACH (Right)   Advance diet Up with therapy Discharge to SNF today  RTC in 2 weeks WBAT

## 2015-05-17 NOTE — Clinical Social Work Placement (Signed)
   CLINICAL SOCIAL WORK PLACEMENT  NOTE  Date:  05/17/2015  Patient Details  Name: Martha Rowe MRN: [id number] Date of Birth: 28-Mar-1941  Clinical Social Work is seeking post-discharge placement for this patient at the [place] level of care (*CSW will initial, date and re-position this form in  chart as items are completed):  Yes   Patient/family provided with [place] Work Department's list of facilities offering this level of care within the geographic area requested by the patient (or if unable, by the patient's family).  Yes   Patient/family informed of their freedom to choose among providers that offer the needed level of care, that participate in Medicare, Medicaid or managed care program needed by the patient, have an available bed and are willing to accept the patient.  Yes   Patient/family informed of [facility]'s ownership interest in [facility] and [facility], as well as of the fact that they are under no obligation to receive care at these facilities.  PASRR submitted to [employer] on 05/13/15     PASRR number received on 05/13/15     Existing PASRR number confirmed on       FL2 transmitted to all facilities in geographic area requested by pt/family on 05/13/15     FL2 transmitted to all facilities within larger geographic area on       Patient informed that his/her managed care company has contracts with or will negotiate with certain facilities, including the following:        Yes   Patient/family informed of bed offers received.  Patient chooses bed at [facility]     Physician recommends and patient chooses bed at      Patient to be transferred to [facility] on 05/17/15.  Patient to be transferred to facility by [place]     Patient family notified on 05/17/15 of transfer.  Name of family member notified:  SPOUSE     PHYSICIAN       Additional Comment: Pt / family are in agreement with d/c to SNF today. PT  approved transport by car. DC Summary sent to SNF for review prior to d/c. DC summary, scripts included in d/c packet. DC packet provided to pt prior to dc.   _______________________________________________ Luretha Rued, LCSW [phone] 05/17/2015, 10:09 AM

## 2016-08-22 IMAGING — CR DG CHEST 1V PORT
1 series · 1 of 1 positions shown · non-contrast
Comparison: None available

CLINICAL DATA: Preop evaluation for hip surgery. Previous lung
cancer.

EXAM:
PORTABLE CHEST - 1 VIEW

[AP]
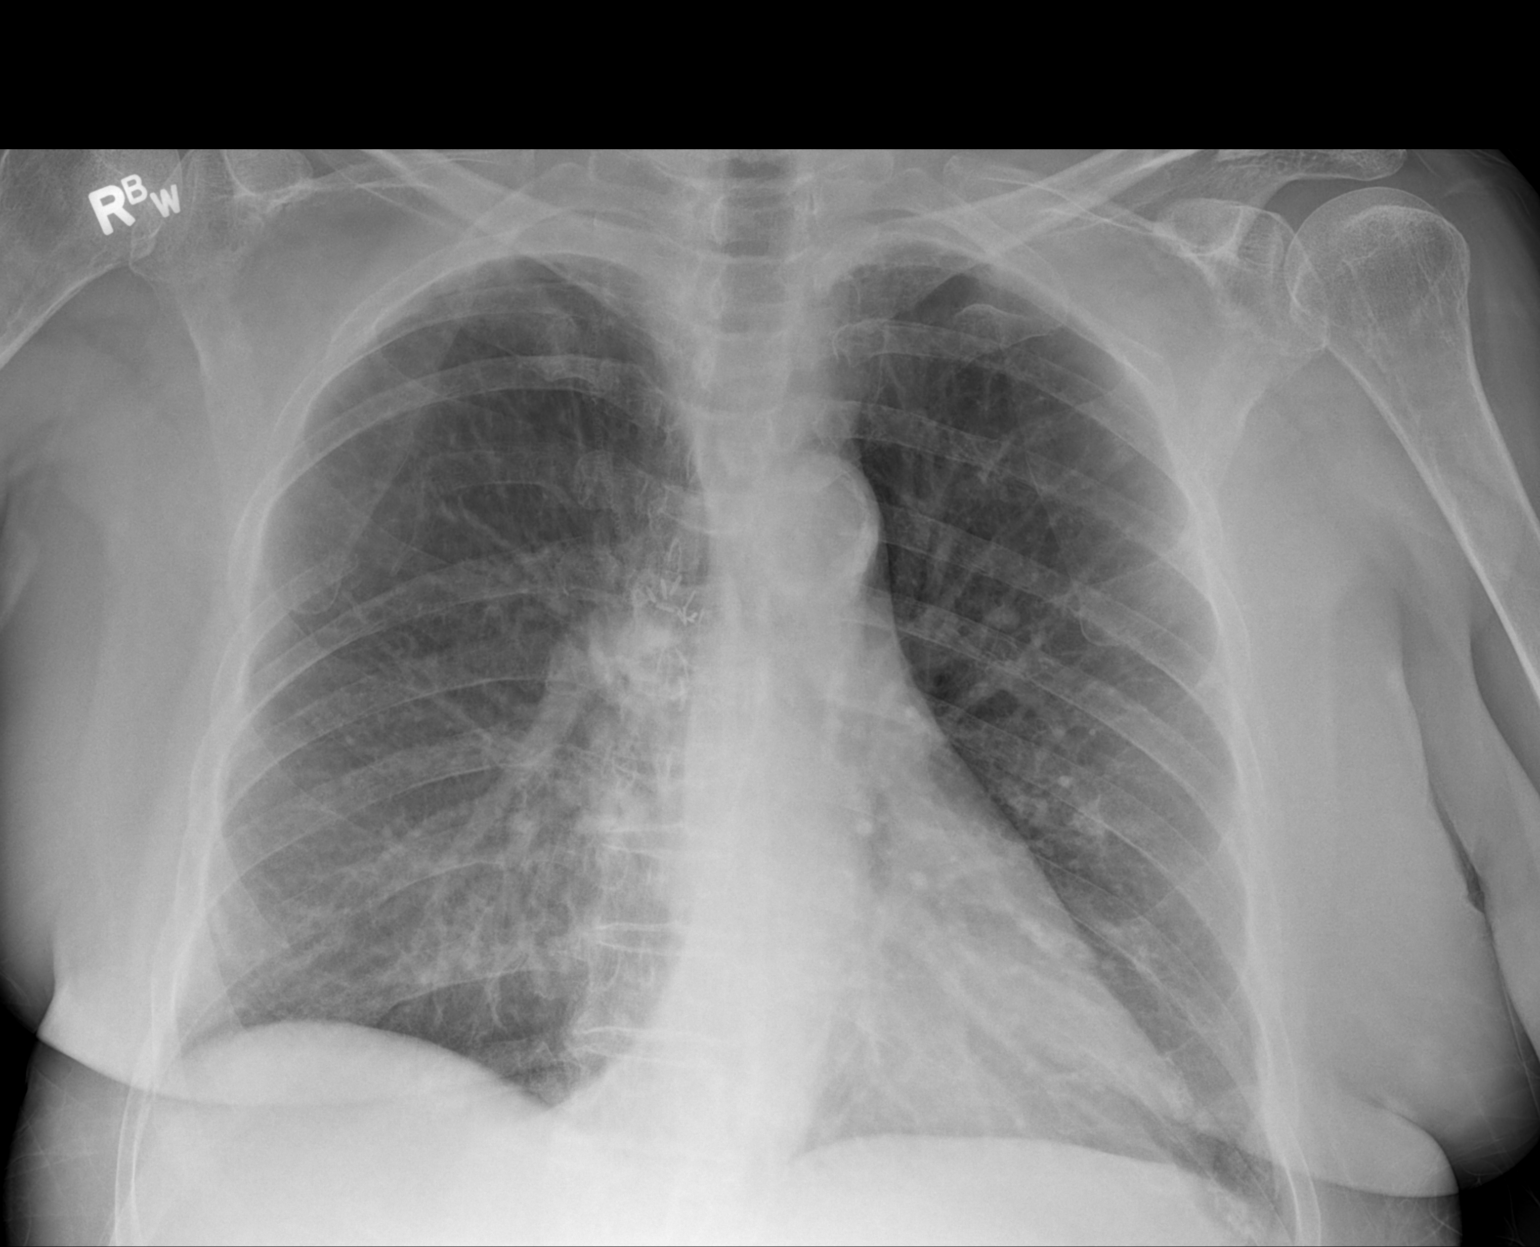

[1 of 1 positions shown; findings below may reference images not displayed]

FINDINGS: Postop changes in the right hilum, suspect prior right upper
lobectomy. Borderline heart size with central vascular prominence.
No focal pneumonia, collapse or consolidation. Negative for edema,
effusion or pneumothorax. Bones are osteopenic. Prior partial rib
resection of the right posterior eighth rib. Atherosclerosis noted
of the aorta. Trachea is midline.
IMPRESSION: Chronic postop findings from prior right lung surgery.

Borderline cardiomegaly and vascular congestion

Aortic atherosclerosis

No superimposed acute process

## 2016-08-22 IMAGING — CR DG HIP (WITH OR WITHOUT PELVIS) 2-3V*R*
3 series · 3 of 3 positions shown · non-contrast
Comparison: None.

CLINICAL DATA: Status post fall, pain lower back and right hip.

EXAM:
DG HIP (WITH OR WITHOUT PELVIS) 2-3V RIGHT

[x pelvis]
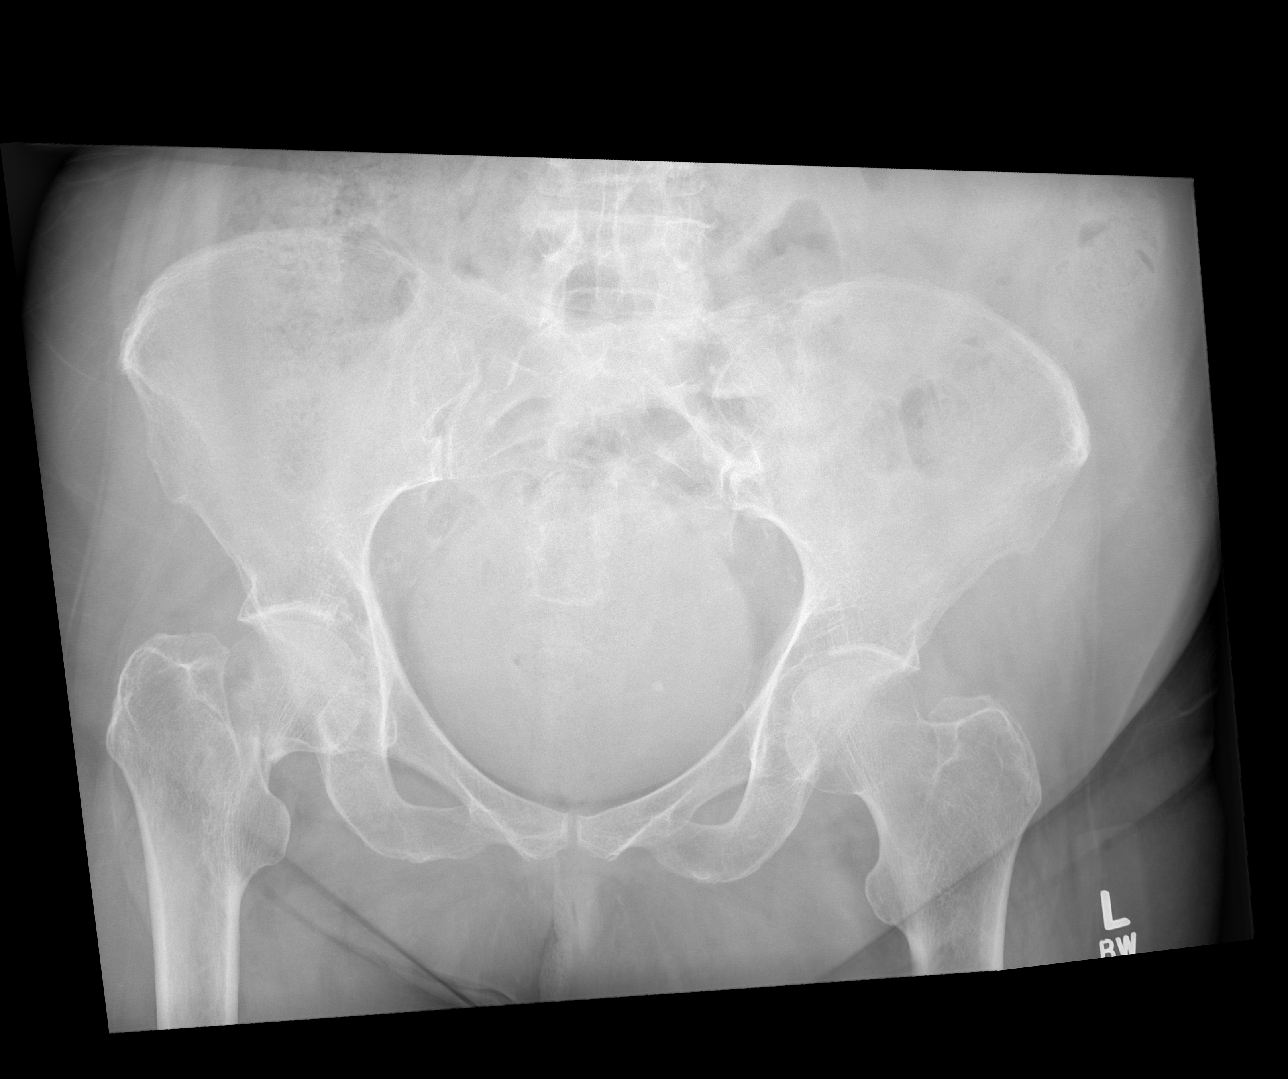

[x hip ap right]
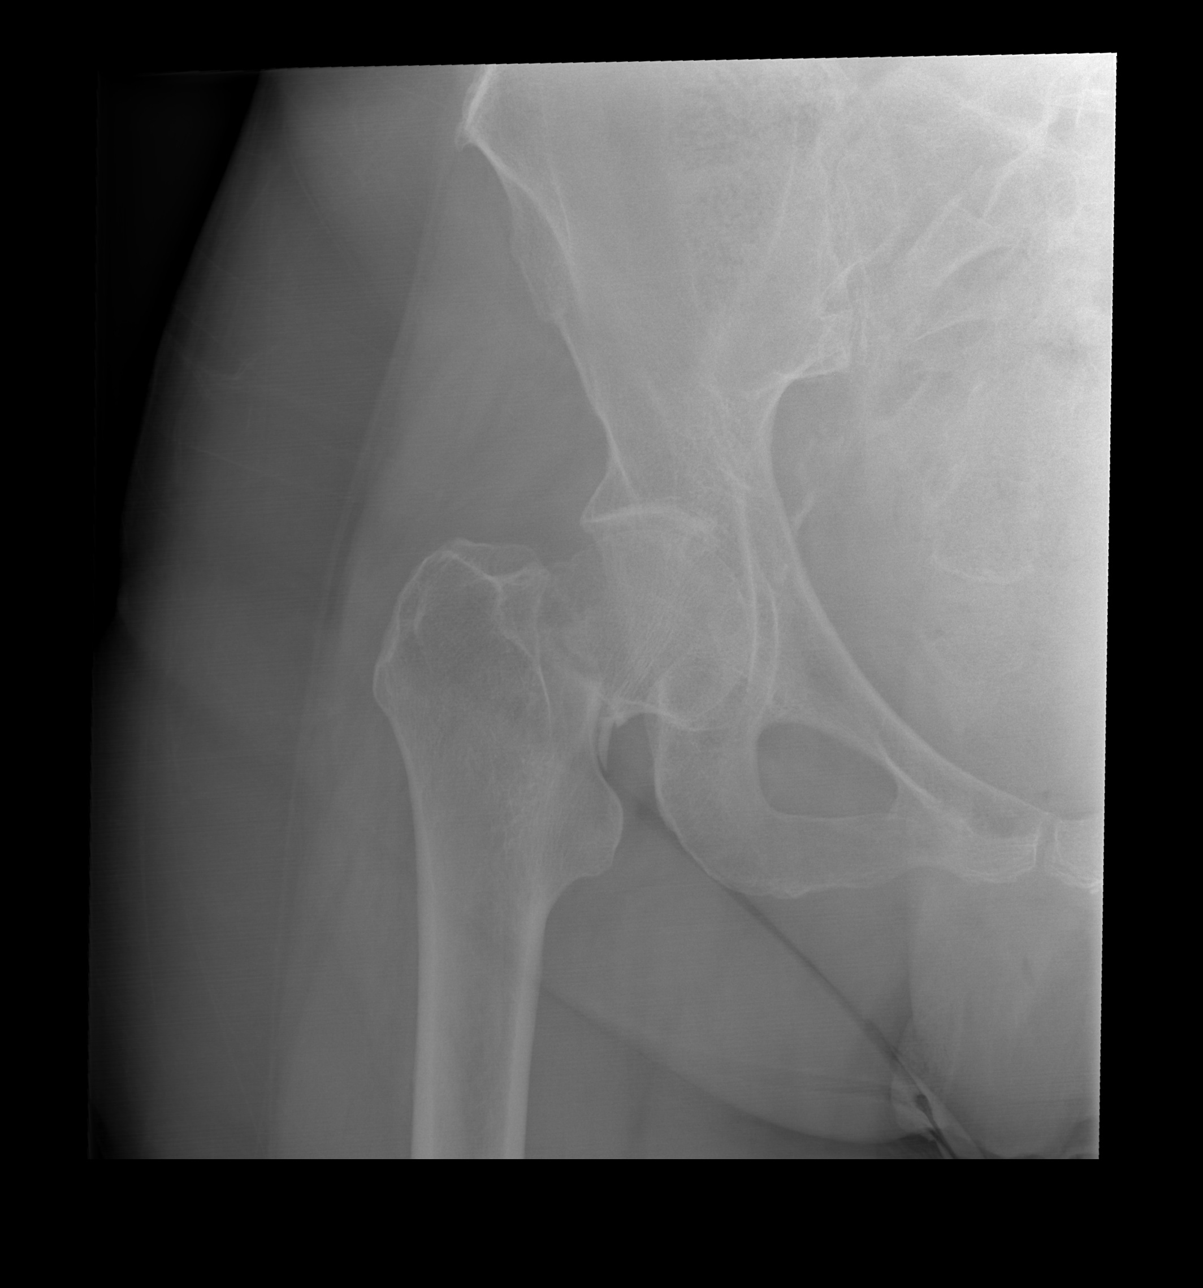

[w hip lat right]
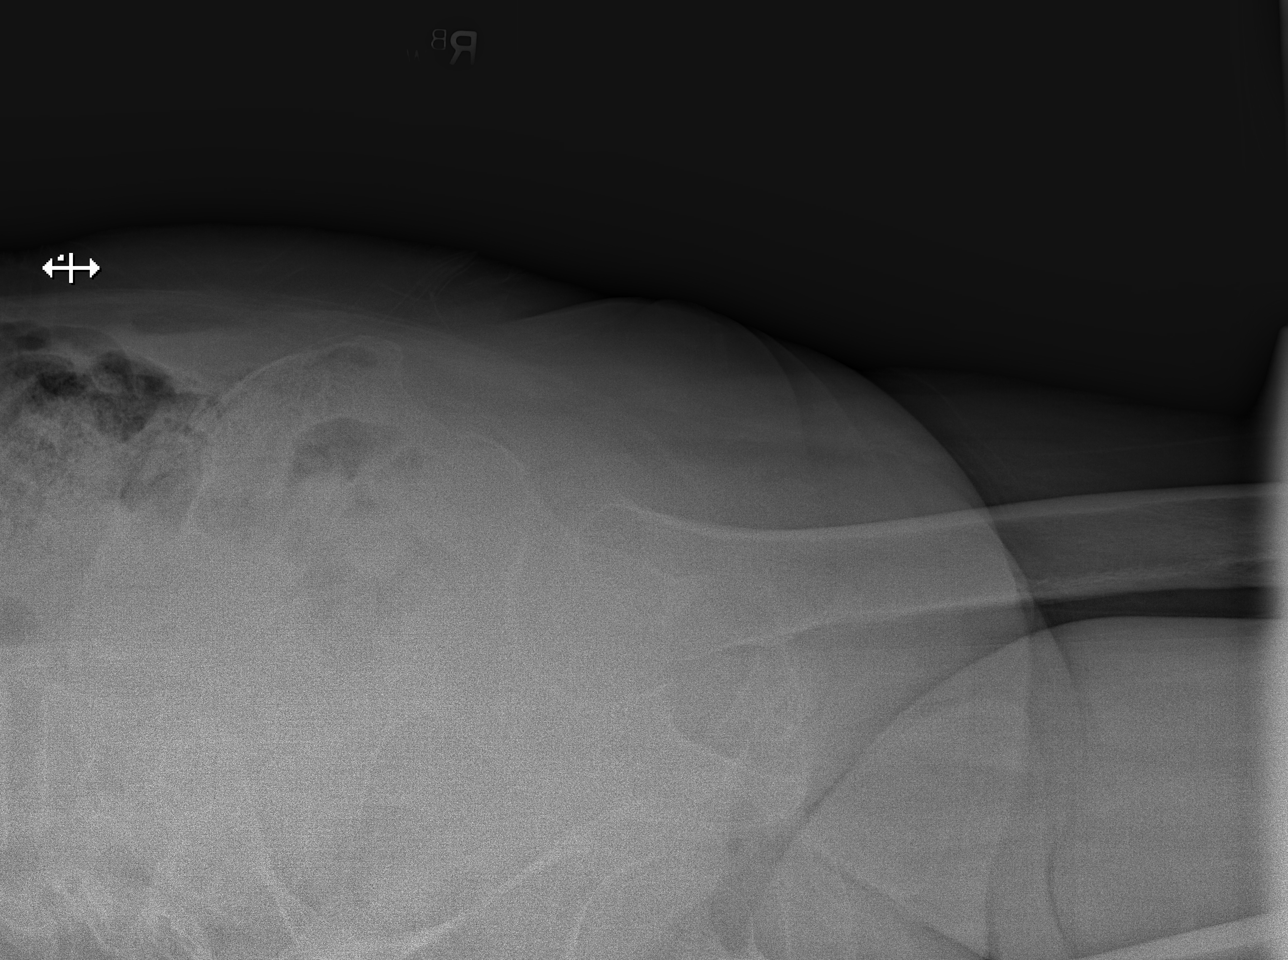

[3 of 3 positions shown; findings below may reference images not displayed]

FINDINGS: There is a displaced fracture within the subcapital region of the
right femoral neck with associated mild angulation deformity and
probable mild impaction at the fracture site. Small avulsion
fracture fragment noted medially. Right femoral head remains well
positioned relative to the acetabulum.

Osseous structures of the adjacent pelvis appear intact and well
aligned. Suspect mild degenerative change in the lower lumbar spine.
Soft tissues about the pelvis and right hip are unremarkable.
IMPRESSION: Displaced fracture within the subcapital region of the right femoral
neck as detailed above. Right femoral head remains well positioned
relative to the acetabulum.

These results were called by telephone at the time of interpretation
on 05/12/2015 at [DATE] to Dr. AFROZA MULLENS , who verbally acknowledged
these results.

## 2016-08-23 IMAGING — DX DG HIP (WITH OR WITHOUT PELVIS) 1V PORT*R*
2 series · 2 of 2 positions shown · non-contrast
Comparison: 05/12/2015

CLINICAL DATA: Hip fracture, operative fixation

EXAM:
DG C-ARM 1-60 MIN - NRPT [facility]; DG HIP (WITH OR WITHOUT PELVIS) 1V
PORT RIGHT

[pelvis ap]
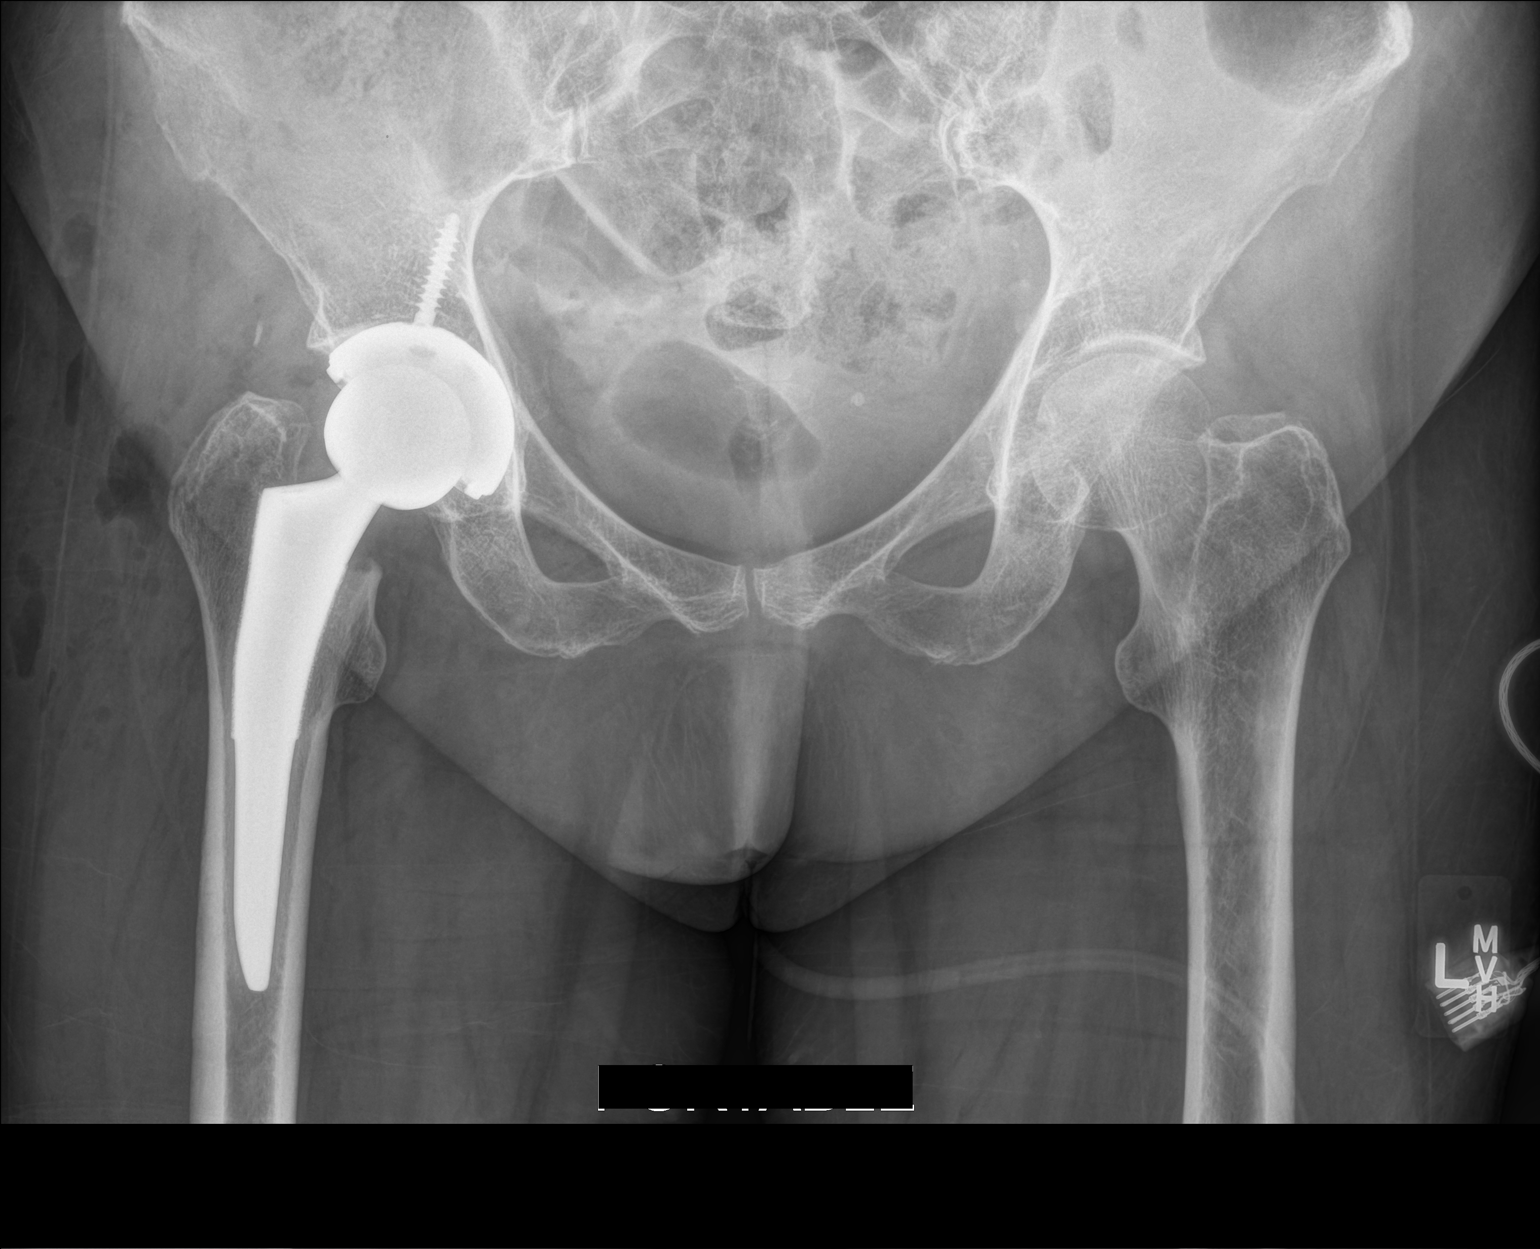

[hip x-table]
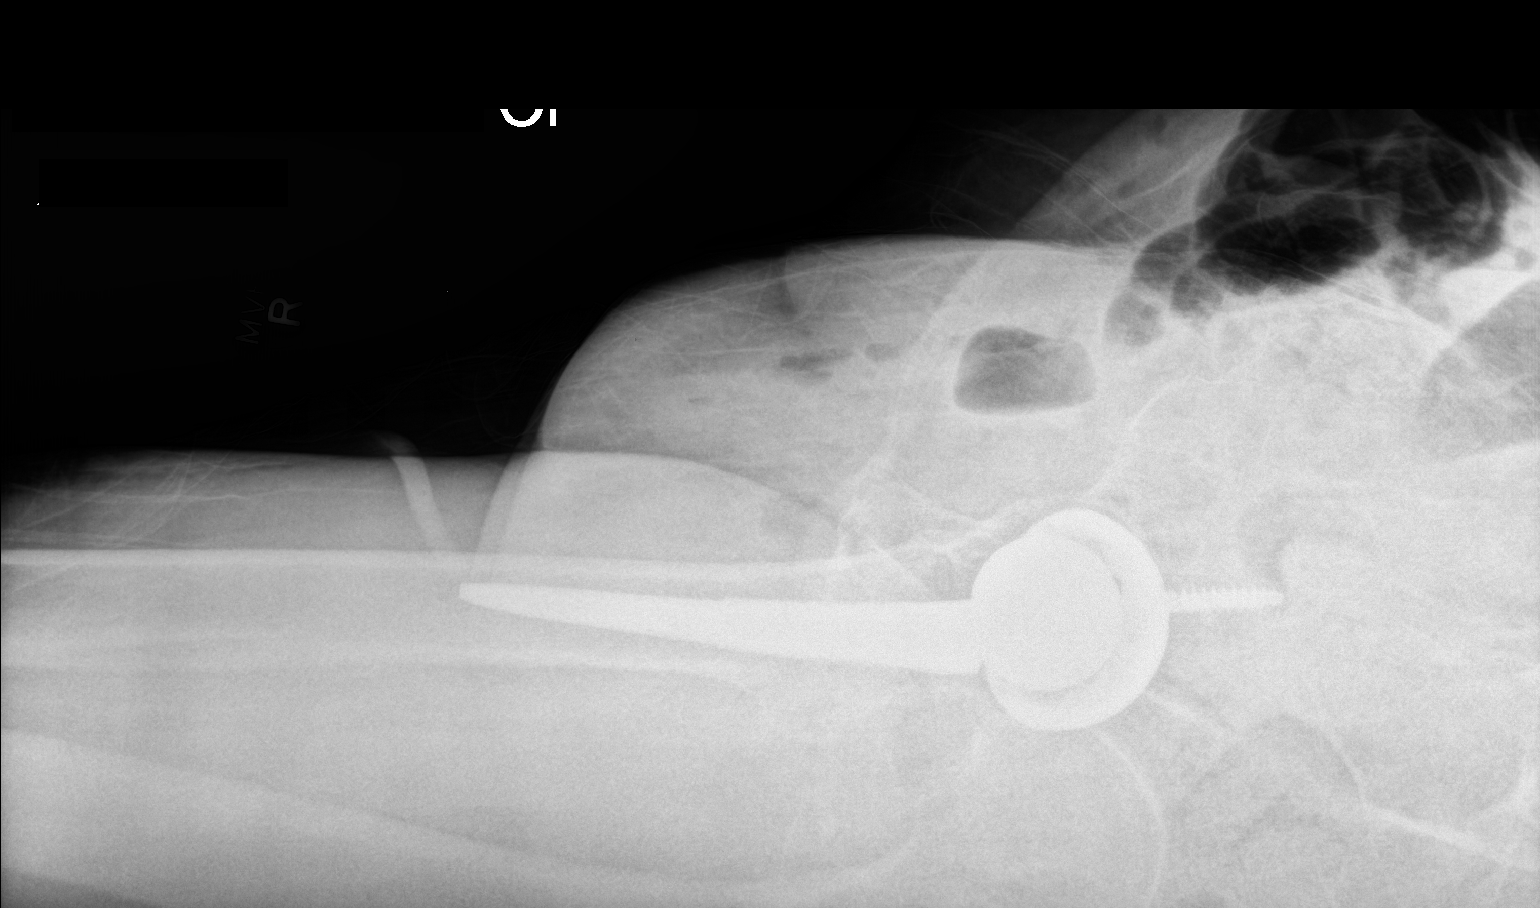

[2 of 2 positions shown; findings below may reference images not displayed]

FINDINGS: Portable pelvis and cross-table lateral views demonstrate right hip
arthroplasty. Components appear alignment. No complicating feature
or acute osseous finding. Bones are osteopenic. Expected
postoperative changes of the right hip soft tissues.
IMPRESSION: Status post right hip arthroplasty.  No complicating feature.

Osteopenia

## 2020-06-16 DEATH — deceased
